# Patient Record
Sex: Female | Born: 1990 | Race: Black or African American | Hispanic: No | Marital: Single | Smoking: Never smoker
Health system: Southern US, Community
[De-identification: ages and names within clinical notes are randomized; demographics above are authoritative.]

## PROBLEM LIST (undated history)

## (undated) DIAGNOSIS — Z9889 Other specified postprocedural states: Secondary | ICD-10-CM

## (undated) DIAGNOSIS — F419 Anxiety disorder, unspecified: Secondary | ICD-10-CM

## (undated) DIAGNOSIS — J45909 Unspecified asthma, uncomplicated: Secondary | ICD-10-CM

## (undated) DIAGNOSIS — M549 Dorsalgia, unspecified: Secondary | ICD-10-CM

## (undated) DIAGNOSIS — F909 Attention-deficit hyperactivity disorder, unspecified type: Secondary | ICD-10-CM

## (undated) DIAGNOSIS — M1990 Unspecified osteoarthritis, unspecified site: Secondary | ICD-10-CM

## (undated) DIAGNOSIS — F32A Depression, unspecified: Secondary | ICD-10-CM

## (undated) DIAGNOSIS — T7840XA Allergy, unspecified, initial encounter: Secondary | ICD-10-CM

## (undated) DIAGNOSIS — R112 Nausea with vomiting, unspecified: Secondary | ICD-10-CM

## (undated) HISTORY — DX: Anxiety disorder, unspecified: F41.9

## (undated) HISTORY — DX: Dorsalgia, unspecified: M54.9

## (undated) HISTORY — DX: Attention-deficit hyperactivity disorder, unspecified type: F90.9

## (undated) HISTORY — DX: Unspecified osteoarthritis, unspecified site: M19.90

## (undated) HISTORY — DX: Allergy, unspecified, initial encounter: T78.40XA

## (undated) HISTORY — DX: Depression, unspecified: F32.A

## (undated) HISTORY — PX: ARTHROSCOPIC REPAIR ACL: SUR80

---

## 2021-01-30 DIAGNOSIS — M6281 Muscle weakness (generalized): Secondary | ICD-10-CM | POA: Diagnosis not present

## 2021-01-30 DIAGNOSIS — M25672 Stiffness of left ankle, not elsewhere classified: Secondary | ICD-10-CM | POA: Diagnosis not present

## 2021-01-30 DIAGNOSIS — M25572 Pain in left ankle and joints of left foot: Secondary | ICD-10-CM | POA: Diagnosis not present

## 2021-02-04 DIAGNOSIS — M6281 Muscle weakness (generalized): Secondary | ICD-10-CM | POA: Diagnosis not present

## 2021-02-04 DIAGNOSIS — M25572 Pain in left ankle and joints of left foot: Secondary | ICD-10-CM | POA: Diagnosis not present

## 2021-02-04 DIAGNOSIS — M25672 Stiffness of left ankle, not elsewhere classified: Secondary | ICD-10-CM | POA: Diagnosis not present

## 2021-02-06 DIAGNOSIS — M25672 Stiffness of left ankle, not elsewhere classified: Secondary | ICD-10-CM | POA: Diagnosis not present

## 2021-02-06 DIAGNOSIS — M6281 Muscle weakness (generalized): Secondary | ICD-10-CM | POA: Diagnosis not present

## 2021-02-06 DIAGNOSIS — M25572 Pain in left ankle and joints of left foot: Secondary | ICD-10-CM | POA: Diagnosis not present

## 2021-02-11 DIAGNOSIS — M6281 Muscle weakness (generalized): Secondary | ICD-10-CM | POA: Diagnosis not present

## 2021-02-11 DIAGNOSIS — M25672 Stiffness of left ankle, not elsewhere classified: Secondary | ICD-10-CM | POA: Diagnosis not present

## 2021-02-11 DIAGNOSIS — M25572 Pain in left ankle and joints of left foot: Secondary | ICD-10-CM | POA: Diagnosis not present

## 2021-02-18 DIAGNOSIS — M25572 Pain in left ankle and joints of left foot: Secondary | ICD-10-CM | POA: Diagnosis not present

## 2021-02-18 DIAGNOSIS — M6281 Muscle weakness (generalized): Secondary | ICD-10-CM | POA: Diagnosis not present

## 2021-02-18 DIAGNOSIS — M25672 Stiffness of left ankle, not elsewhere classified: Secondary | ICD-10-CM | POA: Diagnosis not present

## 2021-02-24 DIAGNOSIS — Z13220 Encounter for screening for lipoid disorders: Secondary | ICD-10-CM | POA: Diagnosis not present

## 2021-02-24 DIAGNOSIS — Z131 Encounter for screening for diabetes mellitus: Secondary | ICD-10-CM | POA: Diagnosis not present

## 2021-02-24 DIAGNOSIS — Z1339 Encounter for screening examination for other mental health and behavioral disorders: Secondary | ICD-10-CM | POA: Diagnosis not present

## 2021-02-24 DIAGNOSIS — Z20822 Contact with and (suspected) exposure to covid-19: Secondary | ICD-10-CM | POA: Diagnosis not present

## 2021-02-24 DIAGNOSIS — R350 Frequency of micturition: Secondary | ICD-10-CM | POA: Diagnosis not present

## 2021-02-24 DIAGNOSIS — Z0000 Encounter for general adult medical examination without abnormal findings: Secondary | ICD-10-CM | POA: Diagnosis not present

## 2021-02-24 DIAGNOSIS — Z7251 High risk heterosexual behavior: Secondary | ICD-10-CM | POA: Diagnosis not present

## 2021-02-24 DIAGNOSIS — E559 Vitamin D deficiency, unspecified: Secondary | ICD-10-CM | POA: Diagnosis not present

## 2021-02-24 DIAGNOSIS — Z114 Encounter for screening for human immunodeficiency virus [HIV]: Secondary | ICD-10-CM | POA: Diagnosis not present

## 2021-02-24 DIAGNOSIS — R5383 Other fatigue: Secondary | ICD-10-CM | POA: Diagnosis not present

## 2021-02-24 DIAGNOSIS — N915 Oligomenorrhea, unspecified: Secondary | ICD-10-CM | POA: Diagnosis not present

## 2021-02-25 DIAGNOSIS — M6281 Muscle weakness (generalized): Secondary | ICD-10-CM | POA: Diagnosis not present

## 2021-02-25 DIAGNOSIS — M25672 Stiffness of left ankle, not elsewhere classified: Secondary | ICD-10-CM | POA: Diagnosis not present

## 2021-02-25 DIAGNOSIS — M25572 Pain in left ankle and joints of left foot: Secondary | ICD-10-CM | POA: Diagnosis not present

## 2021-02-27 DIAGNOSIS — M6281 Muscle weakness (generalized): Secondary | ICD-10-CM | POA: Diagnosis not present

## 2021-02-27 DIAGNOSIS — M25572 Pain in left ankle and joints of left foot: Secondary | ICD-10-CM | POA: Diagnosis not present

## 2021-02-27 DIAGNOSIS — M25672 Stiffness of left ankle, not elsewhere classified: Secondary | ICD-10-CM | POA: Diagnosis not present

## 2021-03-04 DIAGNOSIS — M25672 Stiffness of left ankle, not elsewhere classified: Secondary | ICD-10-CM | POA: Diagnosis not present

## 2021-03-04 DIAGNOSIS — M6281 Muscle weakness (generalized): Secondary | ICD-10-CM | POA: Diagnosis not present

## 2021-03-04 DIAGNOSIS — M25572 Pain in left ankle and joints of left foot: Secondary | ICD-10-CM | POA: Diagnosis not present

## 2021-03-13 DIAGNOSIS — M6281 Muscle weakness (generalized): Secondary | ICD-10-CM | POA: Diagnosis not present

## 2021-03-13 DIAGNOSIS — M25672 Stiffness of left ankle, not elsewhere classified: Secondary | ICD-10-CM | POA: Diagnosis not present

## 2021-03-13 DIAGNOSIS — M25572 Pain in left ankle and joints of left foot: Secondary | ICD-10-CM | POA: Diagnosis not present

## 2021-03-25 DIAGNOSIS — F4312 Post-traumatic stress disorder, chronic: Secondary | ICD-10-CM | POA: Diagnosis not present

## 2021-04-03 DIAGNOSIS — F4312 Post-traumatic stress disorder, chronic: Secondary | ICD-10-CM | POA: Diagnosis not present

## 2021-04-10 DIAGNOSIS — F4312 Post-traumatic stress disorder, chronic: Secondary | ICD-10-CM | POA: Diagnosis not present

## 2021-04-23 DIAGNOSIS — F4312 Post-traumatic stress disorder, chronic: Secondary | ICD-10-CM | POA: Diagnosis not present

## 2021-05-01 DIAGNOSIS — F4312 Post-traumatic stress disorder, chronic: Secondary | ICD-10-CM | POA: Diagnosis not present

## 2021-05-08 DIAGNOSIS — F4312 Post-traumatic stress disorder, chronic: Secondary | ICD-10-CM | POA: Diagnosis not present

## 2021-05-15 DIAGNOSIS — F411 Generalized anxiety disorder: Secondary | ICD-10-CM | POA: Diagnosis not present

## 2021-05-15 DIAGNOSIS — F331 Major depressive disorder, recurrent, moderate: Secondary | ICD-10-CM | POA: Diagnosis not present

## 2021-05-15 DIAGNOSIS — F4310 Post-traumatic stress disorder, unspecified: Secondary | ICD-10-CM | POA: Diagnosis not present

## 2021-05-22 DIAGNOSIS — F4312 Post-traumatic stress disorder, chronic: Secondary | ICD-10-CM | POA: Diagnosis not present

## 2021-05-29 DIAGNOSIS — F4312 Post-traumatic stress disorder, chronic: Secondary | ICD-10-CM | POA: Diagnosis not present

## 2021-06-05 DIAGNOSIS — F4312 Post-traumatic stress disorder, chronic: Secondary | ICD-10-CM | POA: Diagnosis not present

## 2021-06-12 DIAGNOSIS — F4312 Post-traumatic stress disorder, chronic: Secondary | ICD-10-CM | POA: Diagnosis not present

## 2021-06-24 DIAGNOSIS — F331 Major depressive disorder, recurrent, moderate: Secondary | ICD-10-CM | POA: Diagnosis not present

## 2021-06-24 DIAGNOSIS — F900 Attention-deficit hyperactivity disorder, predominantly inattentive type: Secondary | ICD-10-CM | POA: Diagnosis not present

## 2021-06-24 DIAGNOSIS — F4310 Post-traumatic stress disorder, unspecified: Secondary | ICD-10-CM | POA: Diagnosis not present

## 2021-06-24 DIAGNOSIS — F411 Generalized anxiety disorder: Secondary | ICD-10-CM | POA: Diagnosis not present

## 2021-06-26 DIAGNOSIS — F900 Attention-deficit hyperactivity disorder, predominantly inattentive type: Secondary | ICD-10-CM | POA: Diagnosis not present

## 2021-06-26 DIAGNOSIS — F4312 Post-traumatic stress disorder, chronic: Secondary | ICD-10-CM | POA: Diagnosis not present

## 2021-07-03 DIAGNOSIS — F4312 Post-traumatic stress disorder, chronic: Secondary | ICD-10-CM | POA: Diagnosis not present

## 2021-07-17 DIAGNOSIS — F4312 Post-traumatic stress disorder, chronic: Secondary | ICD-10-CM | POA: Diagnosis not present

## 2021-07-24 DIAGNOSIS — F411 Generalized anxiety disorder: Secondary | ICD-10-CM | POA: Diagnosis not present

## 2021-07-24 DIAGNOSIS — F331 Major depressive disorder, recurrent, moderate: Secondary | ICD-10-CM | POA: Diagnosis not present

## 2021-07-24 DIAGNOSIS — F4310 Post-traumatic stress disorder, unspecified: Secondary | ICD-10-CM | POA: Diagnosis not present

## 2021-07-24 DIAGNOSIS — F900 Attention-deficit hyperactivity disorder, predominantly inattentive type: Secondary | ICD-10-CM | POA: Diagnosis not present

## 2021-07-29 DIAGNOSIS — F4312 Post-traumatic stress disorder, chronic: Secondary | ICD-10-CM | POA: Diagnosis not present

## 2021-08-05 DIAGNOSIS — F4312 Post-traumatic stress disorder, chronic: Secondary | ICD-10-CM | POA: Diagnosis not present

## 2021-08-14 DIAGNOSIS — F4312 Post-traumatic stress disorder, chronic: Secondary | ICD-10-CM | POA: Diagnosis not present

## 2021-08-21 DIAGNOSIS — F4310 Post-traumatic stress disorder, unspecified: Secondary | ICD-10-CM | POA: Diagnosis not present

## 2021-08-21 DIAGNOSIS — F411 Generalized anxiety disorder: Secondary | ICD-10-CM | POA: Diagnosis not present

## 2021-08-21 DIAGNOSIS — F900 Attention-deficit hyperactivity disorder, predominantly inattentive type: Secondary | ICD-10-CM | POA: Diagnosis not present

## 2021-08-21 DIAGNOSIS — F4312 Post-traumatic stress disorder, chronic: Secondary | ICD-10-CM | POA: Diagnosis not present

## 2021-08-21 DIAGNOSIS — F331 Major depressive disorder, recurrent, moderate: Secondary | ICD-10-CM | POA: Diagnosis not present

## 2021-08-28 DIAGNOSIS — F4312 Post-traumatic stress disorder, chronic: Secondary | ICD-10-CM | POA: Diagnosis not present

## 2021-09-04 DIAGNOSIS — F4312 Post-traumatic stress disorder, chronic: Secondary | ICD-10-CM | POA: Diagnosis not present

## 2021-09-25 DIAGNOSIS — F4312 Post-traumatic stress disorder, chronic: Secondary | ICD-10-CM | POA: Diagnosis not present

## 2021-10-09 DIAGNOSIS — F4312 Post-traumatic stress disorder, chronic: Secondary | ICD-10-CM | POA: Diagnosis not present

## 2021-10-21 DIAGNOSIS — F411 Generalized anxiety disorder: Secondary | ICD-10-CM | POA: Diagnosis not present

## 2021-10-21 DIAGNOSIS — F331 Major depressive disorder, recurrent, moderate: Secondary | ICD-10-CM | POA: Diagnosis not present

## 2021-10-21 DIAGNOSIS — F900 Attention-deficit hyperactivity disorder, predominantly inattentive type: Secondary | ICD-10-CM | POA: Diagnosis not present

## 2021-10-21 DIAGNOSIS — F4310 Post-traumatic stress disorder, unspecified: Secondary | ICD-10-CM | POA: Diagnosis not present

## 2021-10-25 ENCOUNTER — Telehealth: Payer: Self-pay

## 2021-10-25 NOTE — Telephone Encounter (Signed)
L/m for pt that her appt for [facility] tomorrow will need to be moved to another site.  Appt time of 10:00 can be honored at any of our [facility] sites for 01/02.

## 2021-10-26 ENCOUNTER — Ambulatory Visit: Payer: Self-pay

## 2021-10-30 DIAGNOSIS — F4312 Post-traumatic stress disorder, chronic: Secondary | ICD-10-CM | POA: Diagnosis not present

## 2021-11-06 DIAGNOSIS — F4312 Post-traumatic stress disorder, chronic: Secondary | ICD-10-CM | POA: Diagnosis not present

## 2021-11-13 DIAGNOSIS — F4312 Post-traumatic stress disorder, chronic: Secondary | ICD-10-CM | POA: Diagnosis not present

## 2021-11-24 DIAGNOSIS — F4310 Post-traumatic stress disorder, unspecified: Secondary | ICD-10-CM | POA: Diagnosis not present

## 2021-11-24 DIAGNOSIS — F331 Major depressive disorder, recurrent, moderate: Secondary | ICD-10-CM | POA: Diagnosis not present

## 2021-11-24 DIAGNOSIS — F900 Attention-deficit hyperactivity disorder, predominantly inattentive type: Secondary | ICD-10-CM | POA: Diagnosis not present

## 2021-11-24 DIAGNOSIS — F411 Generalized anxiety disorder: Secondary | ICD-10-CM | POA: Diagnosis not present

## 2021-11-27 DIAGNOSIS — F4312 Post-traumatic stress disorder, chronic: Secondary | ICD-10-CM | POA: Diagnosis not present

## 2021-11-29 ENCOUNTER — Other Ambulatory Visit: Payer: Self-pay

## 2021-11-29 ENCOUNTER — Emergency Department: Payer: BC Managed Care – PPO

## 2021-11-29 ENCOUNTER — Emergency Department
Admission: EM | Admit: 2021-11-29 | Discharge: 2021-11-30 | Disposition: A | Discharge status: Home / Self Care | Payer: BC Managed Care – PPO | Attending: Student in an Organized Health Care Education/Training Program | Admitting: Student in an Organized Health Care Education/Training Program

## 2021-11-29 DIAGNOSIS — N200 Calculus of kidney: Secondary | ICD-10-CM | POA: Diagnosis not present

## 2021-11-29 DIAGNOSIS — M545 Low back pain, unspecified: Secondary | ICD-10-CM | POA: Diagnosis not present

## 2021-11-29 DIAGNOSIS — R1031 Right lower quadrant pain: Secondary | ICD-10-CM | POA: Diagnosis not present

## 2021-11-29 DIAGNOSIS — N132 Hydronephrosis with renal and ureteral calculous obstruction: Secondary | ICD-10-CM | POA: Diagnosis not present

## 2021-11-29 LAB — CBC
HCT: 42.3 % (ref 36.0–46.0)
Hemoglobin: 13.9 g/dL (ref 12.0–15.0)
MCH: 29 pg (ref 26.0–34.0)
MCHC: 32.9 g/dL (ref 30.0–36.0)
MCV: 88.3 fL (ref 80.0–100.0)
Platelets: 290 10*3/uL (ref 150–400)
RBC: 4.79 MIL/uL (ref 3.87–5.11)
RDW: 13.2 % (ref 11.5–15.5)
WBC: 11.8 10*3/uL — ABNORMAL HIGH (ref 4.0–10.5)
nRBC: 0 % (ref 0.0–0.2)

## 2021-11-29 LAB — URINALYSIS, ROUTINE W REFLEX MICROSCOPIC
Bacteria, UA: NONE SEEN
Bilirubin Urine: NEGATIVE
Glucose, UA: NEGATIVE mg/dL
Ketones, ur: NEGATIVE mg/dL
Nitrite: NEGATIVE
Protein, ur: NEGATIVE mg/dL
Specific Gravity, Urine: 1.025 (ref 1.005–1.030)
pH: 8.5 — ABNORMAL HIGH (ref 5.0–8.0)

## 2021-11-29 LAB — BASIC METABOLIC PANEL
Anion gap: 7 (ref 5–15)
BUN: 13 mg/dL (ref 6–20)
CO2: 25 mmol/L (ref 22–32)
Calcium: 8.9 mg/dL (ref 8.9–10.3)
Chloride: 104 mmol/L (ref 98–111)
Creatinine, Ser: 1.07 mg/dL — ABNORMAL HIGH (ref 0.44–1.00)
GFR, Estimated: >60 mL/min (ref >60)
Glucose, Bld: 141 mg/dL — ABNORMAL HIGH (ref 70–99)
Potassium: 3.4 mmol/L — ABNORMAL LOW (ref 3.5–5.1)
Sodium: 136 mmol/L (ref 135–145)

## 2021-11-29 LAB — POC URINE PREG, ED: Preg Test, Ur: NEGATIVE

## 2021-11-29 MED ORDER — ONDANSETRON HCL 4 MG/2ML IJ SOLN
4.0000 mg | Freq: Once | INTRAMUSCULAR | Status: AC
  Filled 2021-11-29: qty 2

## 2021-11-29 MED ORDER — SODIUM CHLORIDE 0.9 % IV BOLUS
1000.0000 mL | Freq: Once | INTRAVENOUS | Status: AC
  Administered 2021-11-29: 1000 mL via INTRAVENOUS

## 2021-11-29 MED ORDER — ONDANSETRON HCL 4 MG/2ML IJ SOLN
INTRAMUSCULAR | Status: AC
  Administered 2021-11-29: 4 mg via INTRAVENOUS
  Filled 2021-11-29: qty 2

## 2021-11-29 MED ORDER — MORPHINE SULFATE (PF) 4 MG/ML IV SOLN
4.0000 mg | INTRAVENOUS | Status: DC | PRN
  Administered 2021-11-29: 4 mg via INTRAVENOUS
  Filled 2021-11-29: qty 1

## 2021-11-29 MED ORDER — KETOROLAC TROMETHAMINE 30 MG/ML IJ SOLN
15.0000 mg | Freq: Once | INTRAMUSCULAR | Status: AC
Start: 2021-11-29 — End: 2021-11-29
  Administered 2021-11-29: 15 mg via INTRAVENOUS
  Filled 2021-11-29: qty 1

## 2021-11-29 NOTE — ED Provider Notes (Signed)
[facility] Provider Note    Event Date/Time   First MD Initiated Contact with Patient 11/29/21 2303     (approximate)   History   Flank Pain   HPI  Tracey Cervantes is a 31 y.o. female who presents to the ER for evaluation of right lower quadrant flank pain that started this afternoon.  Has a history of kidney stones.  She denies any fevers.  Is having some nausea.  States the pain is moderate to severe.  Denies any chest pain or shortness of breath.     Physical Exam   Triage Vital Signs: ED Triage Vitals  Enc Vitals Group     BP 11/29/21 2229 (!) 118/108     Pulse Rate 11/29/21 2229 89     Resp 11/29/21 2229 20     Temp 11/29/21 2229 98 F (36.7 C)     Temp src --      SpO2 11/29/21 2229 100 %     Weight --      Height --      Head Circumference --      Peak Flow --      Pain Score 11/29/21 2232 8     Pain Loc --      Pain Edu? --      Excl. in [place]? --     Most recent vital signs: Vitals:   11/29/21 2229 11/29/21 2356  BP: (!) 118/108 (!) 138/94  Pulse: 89 80  Resp: 20 19  Temp: 98 F (36.7 C)   SpO2: 100% 100%     Constitutional: Alert  Eyes: Conjunctivae are normal.  Head: Atraumatic. Nose: No congestion/rhinnorhea. Mouth/Throat: Mucous membranes are moist.   Neck: Painless ROM.  Cardiovascular:   Good peripheral circulation. Respiratory: Normal respiratory effort.  No retractions.  Gastrointestinal: Soft and nontender. + right CVA ttp Musculoskeletal:  no deformity Neurologic:  MAE spontaneously. No gross focal neurologic deficits are appreciated.  Skin:  Skin is warm, dry and intact. No rash noted. Psychiatric: Mood and affect are normal. Speech and behavior are normal.    ED Results / Procedures / Treatments   Labs (all labs ordered are listed, but only abnormal results are displayed) Labs Reviewed  URINALYSIS, ROUTINE W REFLEX MICROSCOPIC - Abnormal; Notable for the following components:      Result Value   pH  8.5 (*)    Hgb urine dipstick TRACE (*)    Leukocytes,Ua TRACE (*)    All other components within normal limits  BASIC METABOLIC PANEL - Abnormal; Notable for the following components:   Potassium 3.4 (*)    Glucose, Bld 141 (*)    Creatinine, Ser 1.07 (*)    All other components within normal limits  CBC - Abnormal; Notable for the following components:   WBC 11.8 (*)    All other components within normal limits  POC URINE PREG, ED - Normal     EKG     RADIOLOGY Please see ED Course for my review and interpretation.  I personally reviewed all radiographic images ordered to evaluate for the above acute complaints and reviewed radiology reports and findings.  These findings were personally discussed with the patient.  Please see medical record for radiology report.    PROCEDURES:  Critical Care performed: No  Procedures   MEDICATIONS ORDERED IN ED: Medications  morphine (PF) 4 MG/ML injection 4 mg (4 mg Intravenous Given 11/29/21 2351)  ketorolac (TORADOL) 30 MG/ML injection 15 mg (15  mg Intravenous Given 11/29/21 2349)  ondansetron (ZOFRAN) injection 4 mg (4 mg Intravenous Given 11/29/21 2347)  sodium chloride 0.9 % bolus 1,000 mL (1,000 mLs Intravenous New Bag/Given 11/29/21 2349)     IMPRESSION / MDM / ASSESSMENT AND PLAN / ED COURSE  I reviewed the triage vital signs and the nursing notes.                              Differential diagnosis includes, but is not limited to, Pyelonephritis, msk strain, kidney stone, colitis, radiculopathy, shingles  Patient presenting with acute flank pain as described above.  She is hemodynamically stable afebrile.  Mild leukocytosis but normal renal function.  CT imaging ordered to evaluate for the blood differential.  Will order IV narcotic medication including IV morphine as well as IV Toradol.  She is not pregnant we will give IV fluids and reassess.  Clinical Course as of 11/30/21 0036  Nancy Fetter Nov 29, 2021  2304 CT renal by my  review appears consitent with right distal stone. [PR]  Mon Nov 30, 2021  0031 Patient reassessed and feeling improved.  Tolerating p.o.  As her pain is currently well controlled I do not believe that she will require hospitalization is appropriate for outpatient follow-up.  Patient agreeable to plan. [PR]    Clinical Course User Index [PR] Merlyn Lot, MD     FINAL CLINICAL IMPRESSION(S) / ED DIAGNOSES   Final diagnoses:  Kidney stone on right side     Rx / DC Orders   ED Discharge Orders          Ordered    oxyCODONE-acetaminophen (PERCOCET) 5-325 MG tablet  Every 4 hours PRN        11/30/21 0035    ondansetron (ZOFRAN) 4 MG tablet  Every 8 hours PRN        11/30/21 0035    tamsulosin (FLOMAX) 0.4 MG CAPS capsule  Daily after supper        11/30/21 0035             Note:  This document was prepared using Dragon voice recognition software and may include unintentional dictation errors.    Merlyn Lot, MD 11/30/21 [phone]

## 2021-11-29 NOTE — ED Triage Notes (Signed)
Patient reports history of kidney stones, states she has had low back pain which has gradually increased and now has right flank pain that radiates around to abdomen x 5-6 hours. Reports nausea. AOX4, ambulatory to triage.

## 2021-11-30 MED ORDER — TAMSULOSIN HCL 0.4 MG PO CAPS: 0.4000 mg | ORAL_CAPSULE | Freq: Every day | ORAL | 0 refills | Status: DC

## 2021-11-30 MED ORDER — ONDANSETRON HCL 4 MG PO TABS: 4.0000 mg | ORAL_TABLET | Freq: Three times a day (TID) | ORAL | 0 refills | Status: DC | PRN

## 2021-11-30 MED ORDER — OXYCODONE-ACETAMINOPHEN 5-325 MG PO TABS
1.0000 | ORAL_TABLET | ORAL | 0 refills | Status: DC | PRN
Start: 2021-11-30 — End: 2022-06-28

## 2021-12-04 DIAGNOSIS — F4312 Post-traumatic stress disorder, chronic: Secondary | ICD-10-CM | POA: Diagnosis not present

## 2021-12-11 DIAGNOSIS — F4312 Post-traumatic stress disorder, chronic: Secondary | ICD-10-CM | POA: Diagnosis not present

## 2021-12-18 DIAGNOSIS — F4312 Post-traumatic stress disorder, chronic: Secondary | ICD-10-CM | POA: Diagnosis not present

## 2021-12-21 DIAGNOSIS — F900 Attention-deficit hyperactivity disorder, predominantly inattentive type: Secondary | ICD-10-CM | POA: Diagnosis not present

## 2021-12-21 DIAGNOSIS — F4310 Post-traumatic stress disorder, unspecified: Secondary | ICD-10-CM | POA: Diagnosis not present

## 2021-12-21 DIAGNOSIS — F331 Major depressive disorder, recurrent, moderate: Secondary | ICD-10-CM | POA: Diagnosis not present

## 2021-12-30 ENCOUNTER — Encounter: Payer: Self-pay | Admitting: Medical

## 2021-12-30 ENCOUNTER — Ambulatory Visit: Payer: BC Managed Care – PPO | Admitting: Medical

## 2021-12-30 ENCOUNTER — Other Ambulatory Visit: Payer: Self-pay

## 2021-12-30 VITALS — BP 126/78 | HR 83 | Temp 97.4°F | Resp 16

## 2021-12-30 DIAGNOSIS — M25569 Pain in unspecified knee: Secondary | ICD-10-CM | POA: Insufficient documentation

## 2021-12-30 DIAGNOSIS — M2140 Flat foot [pes planus] (acquired), unspecified foot: Secondary | ICD-10-CM | POA: Insufficient documentation

## 2021-12-30 DIAGNOSIS — H5210 Myopia, unspecified eye: Secondary | ICD-10-CM | POA: Insufficient documentation

## 2021-12-30 DIAGNOSIS — Z7189 Other specified counseling: Secondary | ICD-10-CM | POA: Insufficient documentation

## 2021-12-30 DIAGNOSIS — J301 Allergic rhinitis due to pollen: Secondary | ICD-10-CM | POA: Insufficient documentation

## 2021-12-30 DIAGNOSIS — J0100 Acute maxillary sinusitis, unspecified: Secondary | ICD-10-CM

## 2021-12-30 DIAGNOSIS — J45909 Unspecified asthma, uncomplicated: Secondary | ICD-10-CM | POA: Insufficient documentation

## 2021-12-30 DIAGNOSIS — M6750 Plica syndrome, unspecified knee: Secondary | ICD-10-CM | POA: Insufficient documentation

## 2021-12-30 MED ORDER — AMOXICILLIN-POT CLAVULANATE 875-125 MG PO TABS: 1.0000 | ORAL_TABLET | Freq: Two times a day (BID) | ORAL | 0 refills | Status: DC

## 2021-12-30 MED ORDER — PREDNISONE 10 MG (21) PO TBPK: ORAL_TABLET | ORAL | 0 refills | Status: DC

## 2021-12-30 NOTE — Progress Notes (Unsigned)
° °  Subjective:    Patient ID: Tracey Cervantes, female    DOB: 1991-06-10, 31 y.o.   MRN: [id number]  HPI 31 yo female in non acute distress.  Presents today with  symptoms of  nasal congestion, headache, mucus in throat. Maxillary medication.  Restarted Zyrtec 3 weeks ago, using flonase.Marland Kitchen  Hx of Asthma with illness and with going into the cold air.  Advil cold and sinus x since Friday.   Review of Systems  Constitutional:  Negative for chills and fever.  HENT:  Positive for congestion, ear pain, postnasal drip, rhinorrhea, sinus pressure, sinus pain (maxillary) and sore throat (with PND). Negative for sneezing.   Respiratory:  Negative for cough and shortness of breath.   Cardiovascular:  Negative for chest pain.    Allergiy to pinapple and bananas.    Objective:   Physical Exam        Assessment & Plan:

## 2021-12-30 NOTE — Patient Instructions (Signed)

## 2022-01-01 DIAGNOSIS — F4312 Post-traumatic stress disorder, chronic: Secondary | ICD-10-CM | POA: Diagnosis not present

## 2022-01-08 DIAGNOSIS — F4312 Post-traumatic stress disorder, chronic: Secondary | ICD-10-CM | POA: Diagnosis not present

## 2022-01-22 DIAGNOSIS — F4312 Post-traumatic stress disorder, chronic: Secondary | ICD-10-CM | POA: Diagnosis not present

## 2022-02-05 DIAGNOSIS — F4312 Post-traumatic stress disorder, chronic: Secondary | ICD-10-CM | POA: Diagnosis not present

## 2022-02-12 DIAGNOSIS — F4312 Post-traumatic stress disorder, chronic: Secondary | ICD-10-CM | POA: Diagnosis not present

## 2022-02-16 DIAGNOSIS — F900 Attention-deficit hyperactivity disorder, predominantly inattentive type: Secondary | ICD-10-CM | POA: Diagnosis not present

## 2022-02-16 DIAGNOSIS — F4310 Post-traumatic stress disorder, unspecified: Secondary | ICD-10-CM | POA: Diagnosis not present

## 2022-02-16 DIAGNOSIS — F411 Generalized anxiety disorder: Secondary | ICD-10-CM | POA: Diagnosis not present

## 2022-02-16 DIAGNOSIS — F331 Major depressive disorder, recurrent, moderate: Secondary | ICD-10-CM | POA: Diagnosis not present

## 2022-02-25 DIAGNOSIS — F4312 Post-traumatic stress disorder, chronic: Secondary | ICD-10-CM | POA: Diagnosis not present

## 2022-03-05 DIAGNOSIS — F4312 Post-traumatic stress disorder, chronic: Secondary | ICD-10-CM | POA: Diagnosis not present

## 2022-03-23 DIAGNOSIS — F4312 Post-traumatic stress disorder, chronic: Secondary | ICD-10-CM | POA: Diagnosis not present

## 2022-04-02 DIAGNOSIS — F4312 Post-traumatic stress disorder, chronic: Secondary | ICD-10-CM | POA: Diagnosis not present

## 2022-04-13 DIAGNOSIS — F331 Major depressive disorder, recurrent, moderate: Secondary | ICD-10-CM | POA: Diagnosis not present

## 2022-04-13 DIAGNOSIS — F900 Attention-deficit hyperactivity disorder, predominantly inattentive type: Secondary | ICD-10-CM | POA: Diagnosis not present

## 2022-04-13 DIAGNOSIS — F411 Generalized anxiety disorder: Secondary | ICD-10-CM | POA: Diagnosis not present

## 2022-04-13 DIAGNOSIS — F4310 Post-traumatic stress disorder, unspecified: Secondary | ICD-10-CM | POA: Diagnosis not present

## 2022-04-16 DIAGNOSIS — F4312 Post-traumatic stress disorder, chronic: Secondary | ICD-10-CM | POA: Diagnosis not present

## 2022-04-23 DIAGNOSIS — F4312 Post-traumatic stress disorder, chronic: Secondary | ICD-10-CM | POA: Diagnosis not present

## 2022-05-25 DIAGNOSIS — F331 Major depressive disorder, recurrent, moderate: Secondary | ICD-10-CM | POA: Diagnosis not present

## 2022-05-25 DIAGNOSIS — F411 Generalized anxiety disorder: Secondary | ICD-10-CM | POA: Diagnosis not present

## 2022-05-25 DIAGNOSIS — F4310 Post-traumatic stress disorder, unspecified: Secondary | ICD-10-CM | POA: Diagnosis not present

## 2022-05-25 DIAGNOSIS — F900 Attention-deficit hyperactivity disorder, predominantly inattentive type: Secondary | ICD-10-CM | POA: Diagnosis not present

## 2022-06-07 ENCOUNTER — Ambulatory Visit (INDEPENDENT_AMBULATORY_CARE_PROVIDER_SITE_OTHER): Payer: Self-pay | Admitting: Registered Nurse

## 2022-06-07 ENCOUNTER — Encounter: Payer: Self-pay | Admitting: Registered Nurse

## 2022-06-07 VITALS — BP 130/90 | HR 83 | Temp 97.6°F | Ht 63.0 in | Wt 235.6 lb

## 2022-06-07 DIAGNOSIS — R030 Elevated blood-pressure reading, without diagnosis of hypertension: Secondary | ICD-10-CM

## 2022-06-07 DIAGNOSIS — H65192 Other acute nonsuppurative otitis media, left ear: Secondary | ICD-10-CM

## 2022-06-07 MED ORDER — AMOXICILLIN-POT CLAVULANATE 875-125 MG PO TABS: 1.0000 | ORAL_TABLET | Freq: Two times a day (BID) | ORAL | 0 refills | Status: AC

## 2022-06-07 NOTE — Patient Instructions (Signed)

## 2022-06-07 NOTE — Progress Notes (Signed)
Subjective:    Patient ID: Tracey Cervantes, female    DOB: [birth year], 31 y.o.   MRN: [id number]  31y/o african [place] female patient established here for evaluation left ear/jaw pain.  Denied discharge/fever/chills/n/v/d.  Pain does radiate into jaw.  Has noticed some grinding/clenching of teeth.  Has not seen dentist because doesn't feel like typical cavity.  Has a little sinus pressure/pain left side only took OTC pain medication and flonase nasal.      Review of Systems  Constitutional:  Negative for activity change, appetite change, chills, diaphoresis, fatigue and fever.  HENT:  Positive for ear pain, sinus pressure and sinus pain. Negative for congestion, dental problem, drooling, ear discharge, facial swelling, hearing loss, mouth sores, nosebleeds, postnasal drip, rhinorrhea, sneezing, sore throat, tinnitus, trouble swallowing and voice change.   Eyes:  Negative for photophobia and visual disturbance.  Respiratory:  Negative for cough, choking, shortness of breath, wheezing and stridor.   Cardiovascular:  Negative for chest pain.  Gastrointestinal:  Negative for diarrhea, nausea and vomiting.  Endocrine: Negative for cold intolerance and heat intolerance.  Genitourinary:  Negative for difficulty urinating.  Musculoskeletal:  Negative for back pain, gait problem, myalgias, neck pain and neck stiffness.  Skin:  Negative for color change, rash and wound.  Allergic/Immunologic: Positive for environmental allergies. Negative for food allergies.  Neurological:  Negative for dizziness, tremors, seizures, syncope, facial asymmetry, speech difficulty, weakness, light-headedness, numbness and headaches.  Hematological:  Negative for adenopathy. Does not bruise/bleed easily.  Psychiatric/Behavioral:  Negative for agitation, confusion and sleep disturbance.        Objective:   Physical Exam Vitals and nursing note reviewed.  Constitutional:      General: She is awake. She is not in  acute distress.    Appearance: Normal appearance. She is well-developed and well-groomed. She is obese. She is not ill-appearing, toxic-appearing or diaphoretic.  HENT:     Head: Normocephalic and atraumatic.     Jaw: There is normal jaw occlusion. Tenderness and pain on movement present. No trismus, swelling or malocclusion.     Salivary Glands: Right salivary gland is not diffusely enlarged or tender. Left salivary gland is not diffusely enlarged or tender.     Right Ear: Hearing, ear canal and external ear normal. No decreased hearing noted. No laceration, drainage, swelling or tenderness. A middle ear effusion is present. There is no impacted cerumen. No foreign body. No mastoid tenderness. No PE tube. No hemotympanum. Tympanic membrane is not injected, scarred, perforated, erythematous, retracted or bulging.     Left Ear: Hearing, ear canal and external ear normal. No decreased hearing noted. Swelling and tenderness present. No laceration or drainage. A middle ear effusion is present. There is no impacted cerumen. No foreign body. No mastoid tenderness. No PE tube. No hemotympanum. Tympanic membrane is injected, erythematous and bulging. Tympanic membrane is not scarred, perforated or retracted.     Ears:     Comments: Bilateral TMs intact air fluid level clear noted; erythema left TM/canal only; no debris auditory canal or cerumen observed; preauricular/postauricalar TTP left side only; left frontal and maxillary sinuses TTP; cobblestoning posterior pharynx; bilateral allergic shiners    Nose: Mucosal edema present. No nasal deformity, septal deviation, laceration, congestion or rhinorrhea.     Right Sinus: Maxillary sinus tenderness and frontal sinus tenderness present.     Left Sinus: No maxillary sinus tenderness or frontal sinus tenderness.     Mouth/Throat:     Lips: Pink. No lesions.  Mouth: Mucous membranes are moist. Mucous membranes are not pale, not dry and not cyanotic. No  lacerations, oral lesions or angioedema.     Dentition: No dental abscesses or gum lesions.     Pharynx: Uvula midline. Pharyngeal swelling and posterior oropharyngeal erythema present. No oropharyngeal exudate or uvula swelling.     Tonsils: No tonsillar abscesses.  Eyes:     General: Lids are normal. Vision grossly intact. Gaze aligned appropriately. Allergic shiner present. No scleral icterus.       Right eye: No foreign body, discharge or hordeolum.        Left eye: No foreign body, discharge or hordeolum.     Extraocular Movements: Extraocular movements intact.     Right eye: Normal extraocular motion and no nystagmus.     Left eye: Normal extraocular motion and no nystagmus.     Conjunctiva/sclera: Conjunctivae normal.     Right eye: Right conjunctiva is not injected. No chemosis, exudate or hemorrhage.    Left eye: Left conjunctiva is not injected. No chemosis, exudate or hemorrhage.    Pupils: Pupils are equal, round, and reactive to light. Pupils are equal.     Right eye: Pupil is round and reactive.     Left eye: Pupil is round and reactive.  Neck:     Thyroid: No thyroid mass, thyromegaly or thyroid tenderness.     Trachea: Trachea and phonation normal. No tracheal tenderness or tracheal deviation.  Cardiovascular:     Rate and Rhythm: Normal rate and regular rhythm.     Pulses: Normal pulses.          Radial pulses are 2+ on the right side and 2+ on the left side.  Pulmonary:     Effort: Pulmonary effort is normal. No accessory muscle usage or respiratory distress.     Breath sounds: Normal breath sounds and air entry. No stridor, decreased air movement or transmitted upper airway sounds. No decreased breath sounds, wheezing, rhonchi or rales.     Comments: Spoke full sentences without difficulty; respiration even and unlabored RA; no cough observed in exam room Chest:     Chest wall: No tenderness.  Abdominal:     General: There is no distension.     Palpations: Abdomen  is soft.  Musculoskeletal:        General: No tenderness. Normal range of motion.     Right shoulder: Normal.     Right hand: No swelling. Normal strength. Normal capillary refill.     Left hand: No swelling. Normal strength. Normal capillary refill.     Cervical back: Normal range of motion and neck supple. No swelling, edema, deformity, erythema, signs of trauma, lacerations, rigidity, spasms, tenderness or crepitus. No pain with movement, spinous process tenderness or muscular tenderness. Normal range of motion.     Thoracic back: No swelling, edema, deformity, signs of trauma, lacerations or tenderness. Normal range of motion.  Lymphadenopathy:     Head:     Right side of head: No submental, submandibular, tonsillar, preauricular, posterior auricular or occipital adenopathy.     Left side of head: No submental, submandibular, tonsillar, preauricular, posterior auricular or occipital adenopathy.     Cervical: No cervical adenopathy.     Right cervical: No superficial, deep or posterior cervical adenopathy.    Left cervical: No superficial, deep or posterior cervical adenopathy.  Skin:    General: Skin is warm and dry.     Capillary Refill: Capillary refill takes less than 2  seconds.     Coloration: Skin is not ashen, cyanotic, jaundiced, mottled, pale or sallow.     Findings: No abrasion, abscess, acne, bruising, burn, ecchymosis, erythema, signs of injury, laceration, lesion, petechiae, rash or wound.     Nails: There is no clubbing.  Neurological:     General: No focal deficit present.     Mental Status: She is alert and oriented to person, place, and time. Mental status is at baseline. She is not disoriented.     GCS: GCS eye subscore is 4. GCS verbal subscore is 5. GCS motor subscore is 6.     Cranial Nerves: No cranial nerve deficit, dysarthria or facial asymmetry.     Sensory: Sensation is intact. No sensory deficit.     Motor: Motor function is intact. No weakness, tremor,  atrophy, abnormal muscle tone or seizure activity.     Coordination: Coordination is intact. Coordination normal.     Gait: Gait is intact. Gait normal.     Comments: On/off exam table without difficulty; gait sure and steady in clinic; bilateral hand grasp equal 5/5  Psychiatric:        Attention and Perception: Attention and perception normal.        Mood and Affect: Mood and affect normal.        Speech: Speech normal.        Behavior: Behavior normal. Behavior is cooperative.        Thought Content: Thought content normal.        Cognition and Memory: Cognition and memory normal.        Judgment: Judgment normal.           Assessment & Plan:   A-acute left otitis media and elevated blood pressure in clinic without diagnosis hypertension  P-Supportive treatment. Augmentin 875mg  po BID x 10 days #20 RF0 electronic Rx to patient pharmacy of choice  Tylenol 1000mg  po q6h prn pain/fever.   No evidence of invasive bacterial infection, non toxic and well hydrated.  I do not see where any further testing or imaging is necessary at this time.   I will suggest supportive care, rest, good hygiene and encourage the patient to take adequate fluids.  The patient is to return to clinic or EMERGENCY ROOM if symptoms worsen or change significantly e.g. ear pain, fever, purulent discharge from ears or bleeding.  Exitcare handout on otitis media printed and given to patient.   Patient verbalized agreement and understanding of treatment plan and had no further questions at this time.     Most likely related to ear pain/acute illness.  BP check again when not ill/having pain.  Goal less than 130/80.

## 2022-06-11 DIAGNOSIS — Z01419 Encounter for gynecological examination (general) (routine) without abnormal findings: Secondary | ICD-10-CM | POA: Diagnosis not present

## 2022-06-11 DIAGNOSIS — Z6841 Body Mass Index (BMI) 40.0 and over, adult: Secondary | ICD-10-CM | POA: Diagnosis not present

## 2022-06-11 DIAGNOSIS — Z124 Encounter for screening for malignant neoplasm of cervix: Secondary | ICD-10-CM | POA: Diagnosis not present

## 2022-06-11 DIAGNOSIS — Z1151 Encounter for screening for human papillomavirus (HPV): Secondary | ICD-10-CM | POA: Diagnosis not present

## 2022-06-18 DIAGNOSIS — F4312 Post-traumatic stress disorder, chronic: Secondary | ICD-10-CM | POA: Diagnosis not present

## 2022-06-22 DIAGNOSIS — F900 Attention-deficit hyperactivity disorder, predominantly inattentive type: Secondary | ICD-10-CM | POA: Diagnosis not present

## 2022-06-22 DIAGNOSIS — F411 Generalized anxiety disorder: Secondary | ICD-10-CM | POA: Diagnosis not present

## 2022-06-22 DIAGNOSIS — F331 Major depressive disorder, recurrent, moderate: Secondary | ICD-10-CM | POA: Diagnosis not present

## 2022-06-22 DIAGNOSIS — F4310 Post-traumatic stress disorder, unspecified: Secondary | ICD-10-CM | POA: Diagnosis not present

## 2022-06-25 DIAGNOSIS — F4312 Post-traumatic stress disorder, chronic: Secondary | ICD-10-CM | POA: Diagnosis not present

## 2022-06-28 ENCOUNTER — Ambulatory Visit (INDEPENDENT_AMBULATORY_CARE_PROVIDER_SITE_OTHER): Payer: Self-pay | Admitting: Physician Assistant

## 2022-06-28 ENCOUNTER — Encounter: Payer: Self-pay | Admitting: Physician Assistant

## 2022-06-28 VITALS — BP 130/90 | HR 96 | Temp 97.7°F | Ht 63.0 in | Wt 231.6 lb

## 2022-06-28 DIAGNOSIS — R0981 Nasal congestion: Secondary | ICD-10-CM

## 2022-06-28 DIAGNOSIS — U071 COVID-19: Secondary | ICD-10-CM

## 2022-06-28 LAB — POC COVID19 BINAXNOW: SARS Coronavirus 2 Ag: POSITIVE — AB

## 2022-06-28 NOTE — Progress Notes (Signed)
[facility] [address] [place], [place] [address]   Office Visit Note  Patient Name: Tracey Cervantes Date of Birth [id number]  Medical Record number [id number]  Date of Service: 06/28/2022  Chief Complaint  Patient presents with   Sinusitis    Started Thur with sinus headache. Was outside for most of the day. Took Otc med when she got home. Woke up with ST, congested, temp was 100.00, COVID was neg. Felt better after nap. Coughing up mucus and tired over the weekend. Feels like she has fluid in ears. Chest congestion.Ears dont hurt. Had infection in left ear 2 weeks ago.      31 y/o F presents to the clinic for c/o sinus congestion, mild body aches, sore throat and headaches x 4 days ago. She was traveling over the past weekend and came in for covid test due to her symptoms. She tested negative. She went back home to [place] and continued to have URI symptoms with headaches. She did have a low grade fever. She returned yesterday and feeling the same today. Not feeling any better. She is fully vaccinated and boosted. No known covid exposure.   Sinusitis Associated symptoms include congestion, headaches, sinus pressure and a sore throat. Pertinent negatives include no ear pain.   Pt is here for a sick visit.   Current Medication:  Outpatient Encounter Medications as of 06/28/2022  Medication Sig   citalopram (CELEXA) 20 MG tablet Take 20 mg by mouth daily.   CRYSELLE-28 0.3-30 MG-MCG tablet Take 1 tablet by mouth daily.   fluticasone (FLONASE) 50 MCG/ACT nasal spray Place into both nostrils daily.   VYVANSE 30 MG capsule Take 30 mg by mouth daily.   hydrOXYzine (ATARAX) 25 MG tablet Take 25 mg by mouth daily.   [DISCONTINUED] amphetamine-dextroamphetamine (ADDERALL XR) 15 MG 24 hr capsule Take by mouth every morning. (Patient not taking: Reported on 06/07/2022)   [DISCONTINUED] ondansetron (ZOFRAN) 4 MG tablet Take 1 tablet (4 mg total) by mouth every 8 (eight) hours as needed  for nausea or vomiting. (Patient not taking: Reported on 12/30/2021)   [DISCONTINUED] oxyCODONE-acetaminophen (PERCOCET) 5-325 MG tablet Take 1 tablet by mouth every 4 (four) hours as needed for severe pain. (Patient not taking: Reported on 12/30/2021)   [DISCONTINUED] tamsulosin (FLOMAX) 0.4 MG CAPS capsule Take 1 capsule (0.4 mg total) by mouth daily after supper. (Patient not taking: Reported on 12/30/2021)   No facility-administered encounter medications on file as of 06/28/2022.      Medical History: No past medical history on file.   Vital Signs: BP (!) 130/90 (BP Location: Left Arm, Patient Position: Sitting, Cuff Size: Normal)   Pulse 96   Temp 97.7 F (36.5 C) (Tympanic)   Ht 5\' 3"  (1.6 m)   Wt 231 lb 9.6 oz (105.1 kg)   SpO2 100%   BMI 41.03 kg/m    Review of Systems  Constitutional:  Positive for fever.  HENT:  Positive for congestion, postnasal drip, sinus pressure, sinus pain and sore throat. Negative for ear discharge, ear pain and trouble swallowing.   Respiratory: Negative.    Cardiovascular: Negative.   Neurological:  Positive for headaches. Negative for dizziness.    Physical Exam Constitutional:      Appearance: Normal appearance.  HENT:     Head: Atraumatic.     Right Ear: Tympanic membrane, ear canal and external ear normal.     Left Ear: Tympanic membrane, ear canal and external ear normal.  Nose: Nose normal.     Mouth/Throat:     Mouth: Mucous membranes are moist.     Pharynx: Oropharynx is clear.  Eyes:     Extraocular Movements: Extraocular movements intact.  Cardiovascular:     Rate and Rhythm: Normal rate and regular rhythm.  Pulmonary:     Effort: Pulmonary effort is normal.     Breath sounds: Normal breath sounds.  Musculoskeletal:     Cervical back: Neck supple.  Skin:    General: Skin is warm.  Neurological:     Mental Status: She is alert.  Psychiatric:        Mood and Affect: Mood normal.        Behavior: Behavior normal.         Thought Content: Thought content normal.        Judgment: Judgment normal.       Assessment/Plan: 1. Covid-19 virus infection Reviewed positive test result with patient. Stay well hydrated with clear liquids.  Recommended to isolate 5 days from the start of your symptoms then additional 5 days of wearing a mask in public.  Take otc Ibuprofen or Tylenol for symptom relief.  RTC if any difficulty arises or if symptoms don't improve.  Pt verbalized understanding and in agreement.  Continue to watch for worsening symptoms.   2. Sinus congestion Take otc Tylenol sinus cough and cold if needed per bottle directions.     General Counseling: Mckyla verbalizes understanding of the findings of todays visit and agrees with plan of treatment. I have discussed any further diagnostic evaluation that may be needed or ordered today. We also reviewed her medications today. she has been encouraged to call the office with any questions or concerns that should arise related to todays visit.   Orders Placed This Encounter  Procedures   POC COVID-19    No orders of the defined types were placed in this encounter.   Time spent:20 Minutes    Gilberto Better, [place] Physician Assistant

## 2022-07-02 DIAGNOSIS — F4312 Post-traumatic stress disorder, chronic: Secondary | ICD-10-CM | POA: Diagnosis not present

## 2022-07-09 DIAGNOSIS — F4312 Post-traumatic stress disorder, chronic: Secondary | ICD-10-CM | POA: Diagnosis not present

## 2022-07-16 DIAGNOSIS — F4312 Post-traumatic stress disorder, chronic: Secondary | ICD-10-CM | POA: Diagnosis not present

## 2022-07-22 DIAGNOSIS — F4310 Post-traumatic stress disorder, unspecified: Secondary | ICD-10-CM | POA: Diagnosis not present

## 2022-07-22 DIAGNOSIS — F900 Attention-deficit hyperactivity disorder, predominantly inattentive type: Secondary | ICD-10-CM | POA: Diagnosis not present

## 2022-07-22 DIAGNOSIS — F411 Generalized anxiety disorder: Secondary | ICD-10-CM | POA: Diagnosis not present

## 2022-07-22 DIAGNOSIS — F331 Major depressive disorder, recurrent, moderate: Secondary | ICD-10-CM | POA: Diagnosis not present

## 2022-07-24 IMAGING — CT CT RENAL STONE PROTOCOL
2 of 4 series · 16 of 46 positions shown, 18 images · non-contrast
Comparison: None.

CLINICAL DATA: Flank pain. Kidney stone suspected. Low back pain
extending towards the right.



[Series 2: stone full standard · axial · 0.71mm/px · z∈[-1105,-695]mm · 13 of 90 slices shown, 15 images]
[im 4/90  soft-tissue]
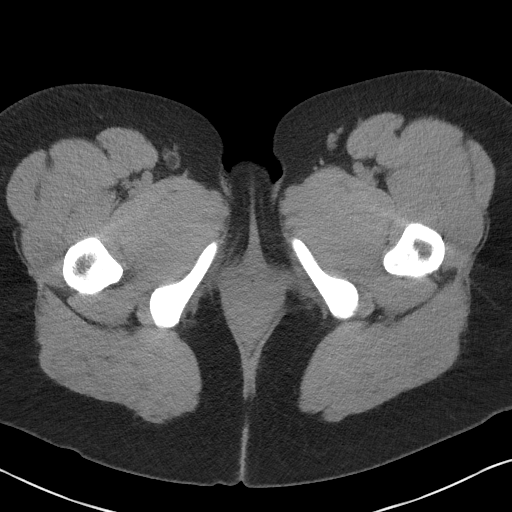
[im 4/90  bone]
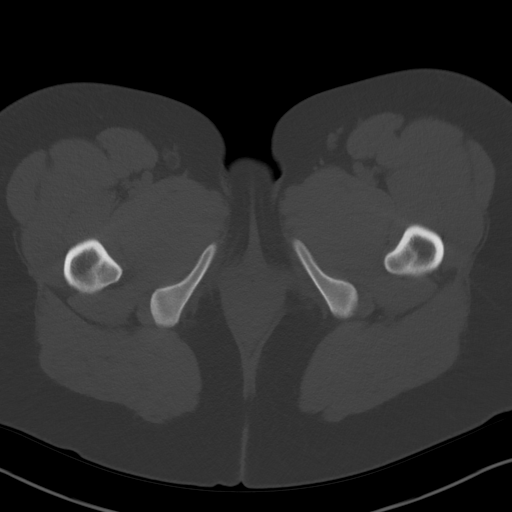
[im 11/90  soft-tissue]
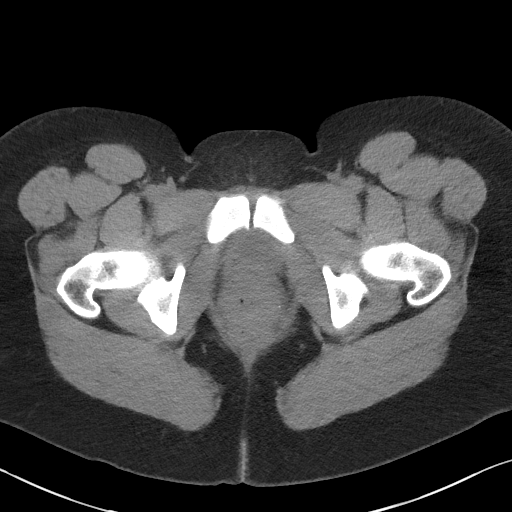
[im 18/90  soft-tissue]
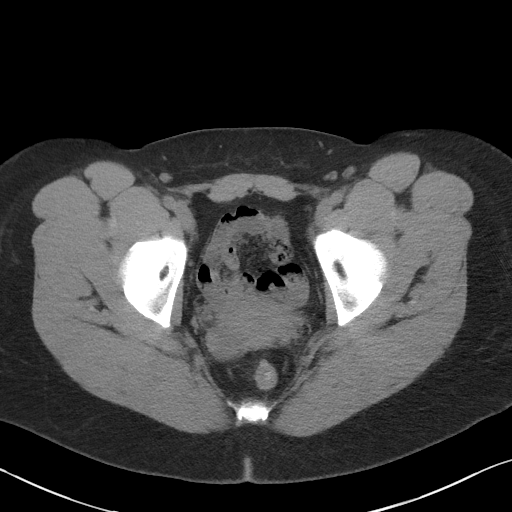
[im 25/90  soft-tissue]
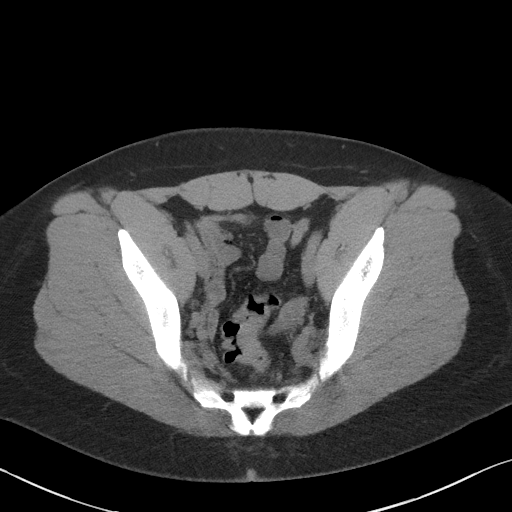
[im 33/90  soft-tissue]
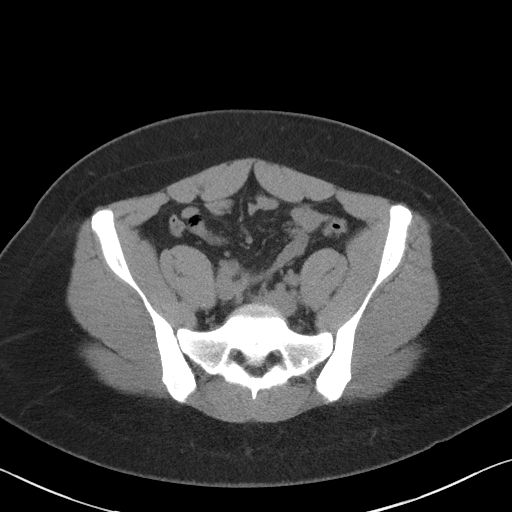
[im 40/90  soft-tissue]
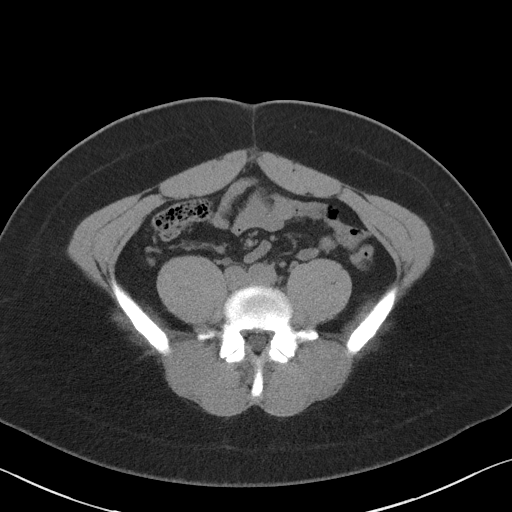
[im 47/90  soft-tissue]
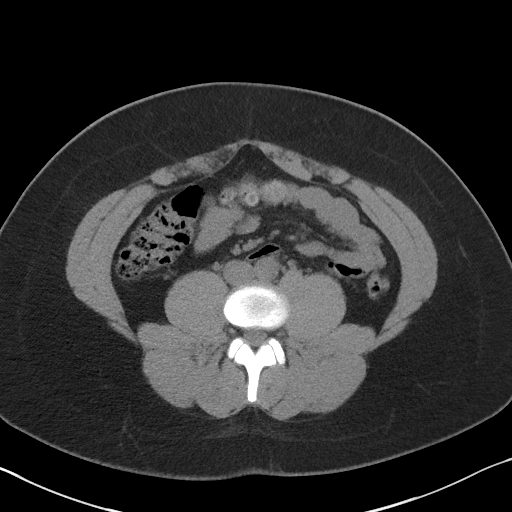
[im 50/90  soft-tissue]
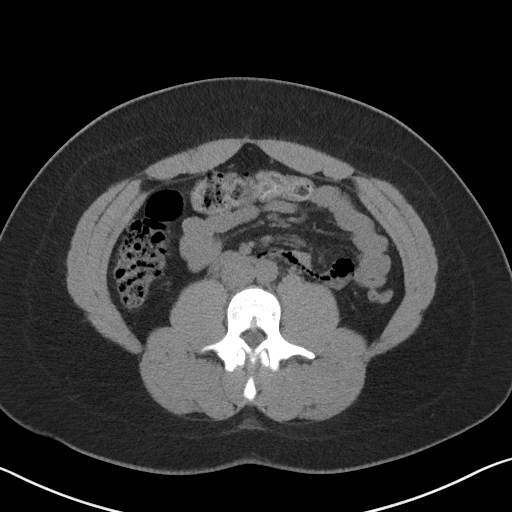
[im 57/90  soft-tissue]
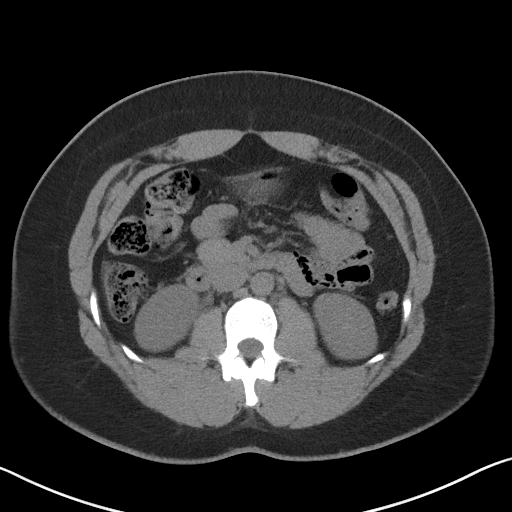
[im 57/90  bone]
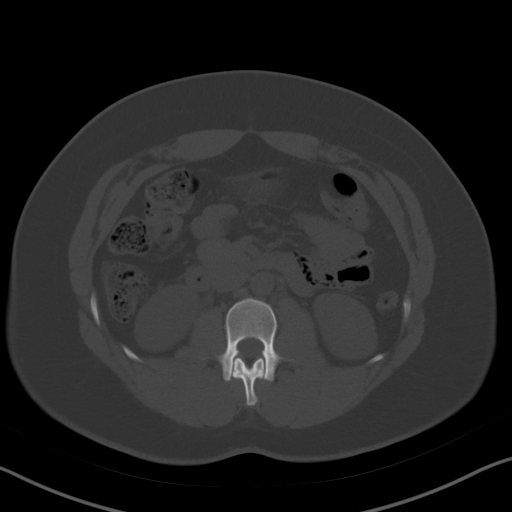
[im 65/90  soft-tissue]
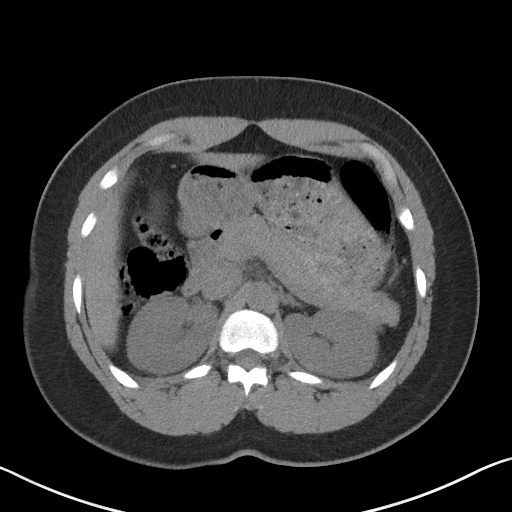
[im 72/90  soft-tissue]
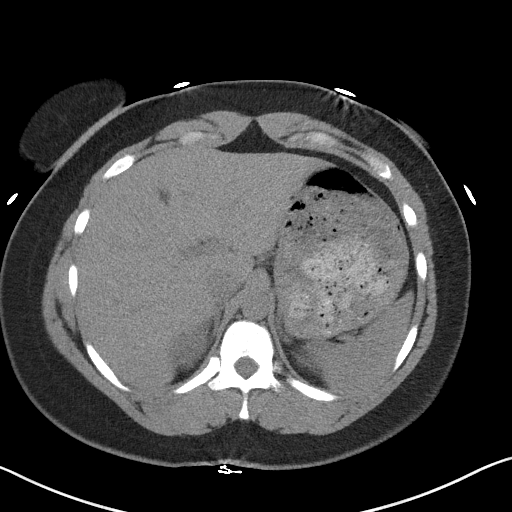
[im 79/90  soft-tissue]
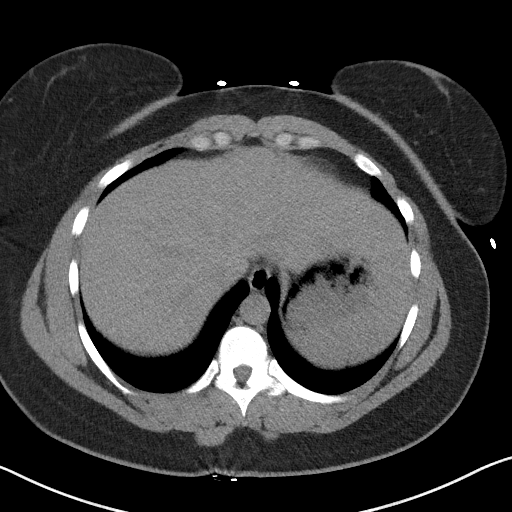
[im 86/90  soft-tissue]
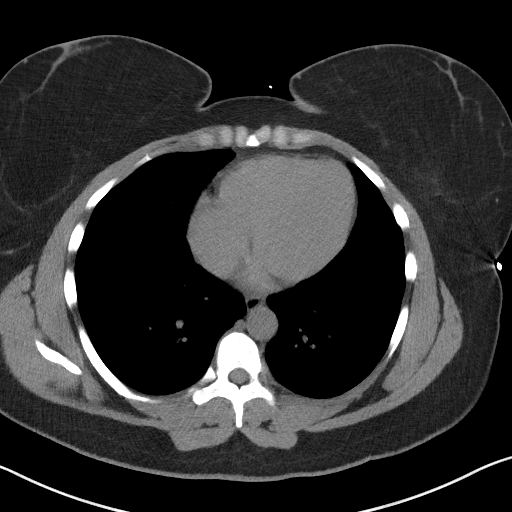

[Series 5: coronal · coronal · 0.70mm/px · 3 of 146 slices shown]
[im 49/146  soft-tissue]
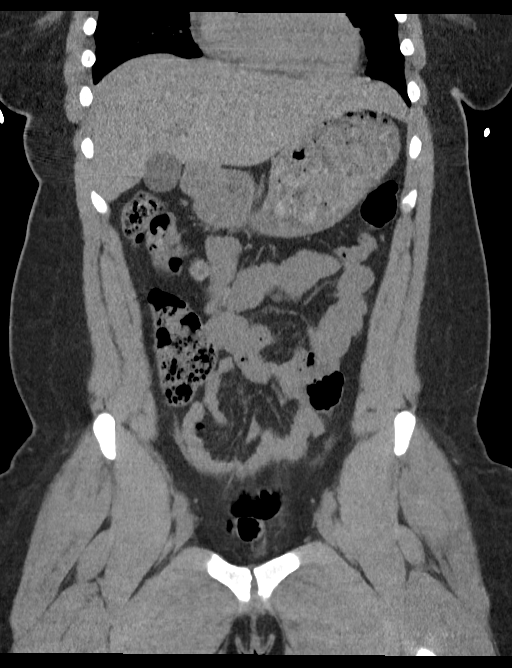
[im 65/146  soft-tissue]
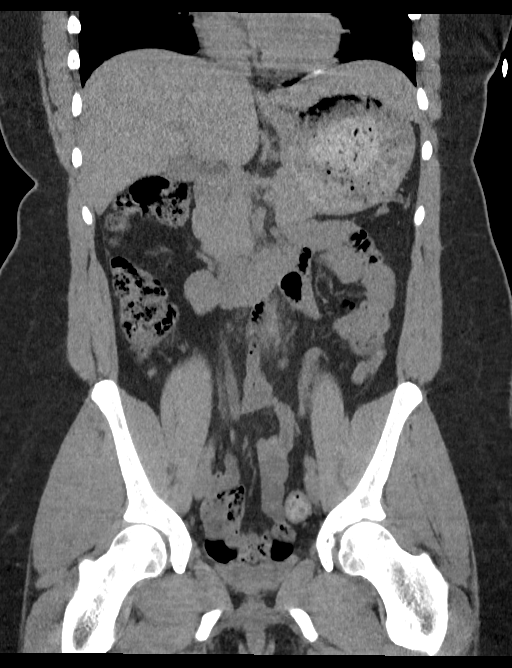
[im 81/146  soft-tissue]
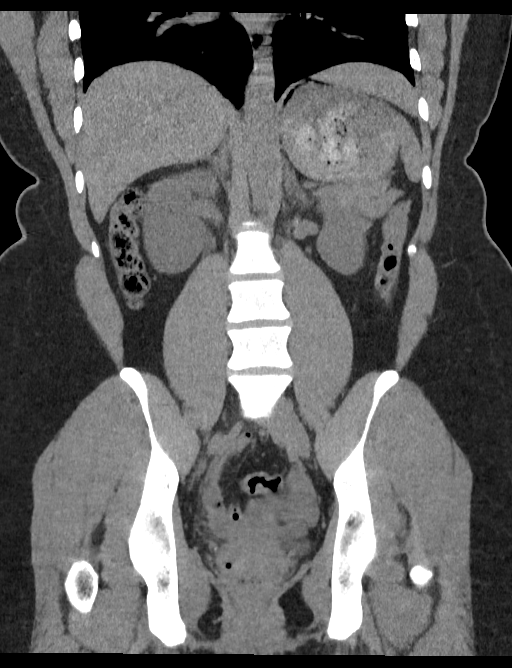

[16 of 46 positions shown; findings below may reference images not displayed]

FINDINGS: Lower chest: Lung bases are clear.

Hepatobiliary: Liver parenchyma is normal.  No calcified gallstones.

Pancreas: Normal

Spleen: Normal

Adrenals/Urinary Tract: Adrenal glands are normal. Left kidney
contains a 3 mm nonobstructing stone in the midportion. No
hydronephrosis on the left. Right kidney contains 4 are 5 small
stones, the largest in the midportion measuring 4 mm. There is mild
fullness of the renal collecting system and ureter to the level of
the UVJ where there is a 2 mm stone. No stone in the bladder.

Stomach/Bowel: Normal

Vascular/Lymphatic: Normal

Reproductive: Normal

Other: No free fluid or air.

Musculoskeletal: Normal
IMPRESSION: 2 mm stone at the right UVJ with mild right hydroureteronephrosis. 4
or 5 nonobstructing stones in the right kidney, the largest 4 mm.
Single nonobstructing 3 mm stone in the left kidney.

## 2022-07-27 DIAGNOSIS — F4312 Post-traumatic stress disorder, chronic: Secondary | ICD-10-CM | POA: Diagnosis not present

## 2022-08-06 DIAGNOSIS — F4312 Post-traumatic stress disorder, chronic: Secondary | ICD-10-CM | POA: Diagnosis not present

## 2022-08-13 DIAGNOSIS — F4312 Post-traumatic stress disorder, chronic: Secondary | ICD-10-CM | POA: Diagnosis not present

## 2022-08-18 DIAGNOSIS — F4310 Post-traumatic stress disorder, unspecified: Secondary | ICD-10-CM | POA: Diagnosis not present

## 2022-08-18 DIAGNOSIS — F331 Major depressive disorder, recurrent, moderate: Secondary | ICD-10-CM | POA: Diagnosis not present

## 2022-08-18 DIAGNOSIS — F900 Attention-deficit hyperactivity disorder, predominantly inattentive type: Secondary | ICD-10-CM | POA: Diagnosis not present

## 2022-08-18 DIAGNOSIS — F411 Generalized anxiety disorder: Secondary | ICD-10-CM | POA: Diagnosis not present

## 2022-08-20 DIAGNOSIS — F4312 Post-traumatic stress disorder, chronic: Secondary | ICD-10-CM | POA: Diagnosis not present

## 2022-09-03 DIAGNOSIS — F4312 Post-traumatic stress disorder, chronic: Secondary | ICD-10-CM | POA: Diagnosis not present

## 2022-09-14 DIAGNOSIS — F900 Attention-deficit hyperactivity disorder, predominantly inattentive type: Secondary | ICD-10-CM | POA: Diagnosis not present

## 2022-09-14 DIAGNOSIS — F411 Generalized anxiety disorder: Secondary | ICD-10-CM | POA: Diagnosis not present

## 2022-09-14 DIAGNOSIS — F331 Major depressive disorder, recurrent, moderate: Secondary | ICD-10-CM | POA: Diagnosis not present

## 2022-09-14 DIAGNOSIS — F4310 Post-traumatic stress disorder, unspecified: Secondary | ICD-10-CM | POA: Diagnosis not present

## 2022-09-28 DIAGNOSIS — F4312 Post-traumatic stress disorder, chronic: Secondary | ICD-10-CM | POA: Diagnosis not present

## 2022-09-29 NOTE — Progress Notes (Unsigned)
   There were no vitals taken for this visit.   Subjective:    Patient ID: Tracey Cervantes, female    DOB: [birth year], 31 y.o.   MRN: [id number]  HPI: Tracey Cervantes is a 31 y.o. female  No chief complaint on file.  Establish care:  her last physical was ***. Her last pap was ***.  Medical history includes ***.  Family history includes ***.  Asthma:  Back pain: She would like to discussed breast reduction due to back pain.   Relevant past medical, surgical, family and social history reviewed and updated as indicated. Interim medical history since our last visit reviewed. Allergies and medications reviewed and updated.  Review of Systems  Constitutional: Negative for fever or weight change.  Respiratory: Negative for cough and shortness of breath.   Cardiovascular: Negative for chest pain or palpitations.  Gastrointestinal: Negative for abdominal pain, no bowel changes.  Musculoskeletal: Negative for gait problem or joint swelling.  Skin: Negative for rash.  Neurological: Negative for dizziness or headache.  No other specific complaints in a complete review of systems (except as listed in HPI above).      Objective:    There were no vitals taken for this visit.  Wt Readings from Last 3 Encounters:  06/28/22 231 lb 9.6 oz (105.1 kg)  06/07/22 235 lb 9.6 oz (106.9 kg)    Physical Exam  Constitutional: Patient appears well-developed and well-nourished. Obese *** No distress.  HEENT: head atraumatic, normocephalic, pupils equal and reactive to light, ears ***, neck supple, throat within normal limits Cardiovascular: Normal rate, regular rhythm and normal heart sounds.  No murmur heard. No BLE edema. Pulmonary/Chest: Effort normal and breath sounds normal. No respiratory distress. Abdominal: Soft.  There is no tenderness. Psychiatric: Patient has a normal mood and affect. behavior is normal. Judgment and thought content normal.     Assessment & Plan:   Problem List Items  Addressed This Visit   None    Follow up plan: No follow-ups on file.

## 2022-09-30 ENCOUNTER — Ambulatory Visit (INDEPENDENT_AMBULATORY_CARE_PROVIDER_SITE_OTHER): Payer: Self-pay | Admitting: Nurse Practitioner

## 2022-09-30 ENCOUNTER — Other Ambulatory Visit: Payer: Self-pay

## 2022-09-30 ENCOUNTER — Encounter: Payer: Self-pay | Admitting: Nurse Practitioner

## 2022-09-30 VITALS — BP 112/72 | HR 98 | Temp 98.8°F | Resp 16 | Ht 63.0 in | Wt 239.2 lb

## 2022-09-30 DIAGNOSIS — Z113 Encounter for screening for infections with a predominantly sexual mode of transmission: Secondary | ICD-10-CM

## 2022-09-30 DIAGNOSIS — F330 Major depressive disorder, recurrent, mild: Secondary | ICD-10-CM | POA: Insufficient documentation

## 2022-09-30 DIAGNOSIS — Z114 Encounter for screening for human immunodeficiency virus [HIV]: Secondary | ICD-10-CM

## 2022-09-30 DIAGNOSIS — J301 Allergic rhinitis due to pollen: Secondary | ICD-10-CM

## 2022-09-30 DIAGNOSIS — Z131 Encounter for screening for diabetes mellitus: Secondary | ICD-10-CM

## 2022-09-30 DIAGNOSIS — Z7689 Persons encountering health services in other specified circumstances: Secondary | ICD-10-CM

## 2022-09-30 DIAGNOSIS — Z1159 Encounter for screening for other viral diseases: Secondary | ICD-10-CM

## 2022-09-30 DIAGNOSIS — Z13220 Encounter for screening for lipoid disorders: Secondary | ICD-10-CM

## 2022-09-30 DIAGNOSIS — Z130 Encounter for screening for diseases of the blood and blood-forming organs and certain disorders involving the immune mechanism: Secondary | ICD-10-CM

## 2022-09-30 DIAGNOSIS — N62 Hypertrophy of breast: Secondary | ICD-10-CM

## 2022-09-30 DIAGNOSIS — F411 Generalized anxiety disorder: Secondary | ICD-10-CM

## 2022-09-30 DIAGNOSIS — F902 Attention-deficit hyperactivity disorder, combined type: Secondary | ICD-10-CM

## 2022-09-30 DIAGNOSIS — Z6841 Body Mass Index (BMI) 40.0 and over, adult: Secondary | ICD-10-CM

## 2022-09-30 DIAGNOSIS — J4520 Mild intermittent asthma, uncomplicated: Secondary | ICD-10-CM

## 2022-09-30 MED ORDER — MONTELUKAST SODIUM 10 MG PO TABS: 10.0000 mg | ORAL_TABLET | Freq: Every day | ORAL | 3 refills | Status: DC

## 2022-09-30 NOTE — Assessment & Plan Note (Signed)
Singulair 10 mg daily.  Continue Flonase.

## 2022-09-30 NOTE — Assessment & Plan Note (Signed)
Start Singulair 10 mg daily.

## 2022-09-30 NOTE — Assessment & Plan Note (Signed)
Continue follow-up care with psychiatry.  Continue taking Effexor 150 mg daily and hydroxyzine 25 mg daily, Vyvanse 30 mg daily. 

## 2022-09-30 NOTE — Assessment & Plan Note (Signed)
Continue follow-up care with psychiatry.  Continue taking Effexor 150 mg daily and hydroxyzine 25 mg daily, Vyvanse 30 mg daily.

## 2022-09-30 NOTE — Assessment & Plan Note (Signed)
She has been working on lifestyle modification.  Patient has been unsuccessful with weight loss.  She would like to try zepbound.  Discussed side effects, and administration.  Discussed that it is not available at this time.  Like to start as soon as it is.  She is also interested in the [facility].  Referral placed.  Denies any pancreatitis or family history of thyroid cancer.

## 2022-10-01 LAB — COMPLETE METABOLIC PANEL WITH GFR
AG Ratio: 1.3 (calc) (ref 1.0–2.5)
ALT: 20 U/L (ref 6–29)
AST: 14 U/L (ref 10–30)
Albumin: 4 g/dL (ref 3.6–5.1)
Alkaline phosphatase (APISO): 71 U/L (ref 31–125)
BUN: 10 mg/dL (ref 7–25)
CO2: 28 mmol/L (ref 20–32)
Calcium: 9.3 mg/dL (ref 8.6–10.2)
Chloride: 106 mmol/L (ref 98–110)
Creat: 0.89 mg/dL (ref 0.50–0.97)
Globulin: 3.2 g/dL (calc) (ref 1.9–3.7)
Glucose, Bld: 112 mg/dL — ABNORMAL HIGH (ref 65–99)
Potassium: 4.4 mmol/L (ref 3.5–5.3)
Sodium: 142 mmol/L (ref 135–146)
Total Bilirubin: 0.3 mg/dL (ref 0.2–1.2)
Total Protein: 7.2 g/dL (ref 6.1–8.1)
eGFR: 89 mL/min/{1.73_m2} (ref >=60)

## 2022-10-01 LAB — LIPID PANEL
Cholesterol: 179 mg/dL (ref <200)
HDL: 61 mg/dL (ref >=50)
LDL Cholesterol (Calc): 102 mg/dL (calc) — ABNORMAL HIGH
Non-HDL Cholesterol (Calc): 118 mg/dL (calc) (ref <130)
Total CHOL/HDL Ratio: 2.9 (calc) (ref <5.0)
Triglycerides: 70 mg/dL (ref <150)

## 2022-10-01 LAB — RPR: RPR Ser Ql: NONREACTIVE

## 2022-10-01 LAB — CBC WITH DIFFERENTIAL/PLATELET
Absolute Monocytes: 566 cells/uL (ref 200–950)
Basophils Absolute: 21 cells/uL (ref 0–200)
Basophils Relative: 0.3 %
Eosinophils Absolute: 83 cells/uL (ref 15–500)
Eosinophils Relative: 1.2 %
HCT: 38.9 % (ref 35.0–45.0)
Hemoglobin: 13.1 g/dL (ref 11.7–15.5)
Lymphs Abs: 2325 cells/uL (ref 850–3900)
MCH: 29.2 pg (ref 27.0–33.0)
MCHC: 33.7 g/dL (ref 32.0–36.0)
MCV: 86.6 fL (ref 80.0–100.0)
MPV: 9.1 fL (ref 7.5–12.5)
Monocytes Relative: 8.2 %
Neutro Abs: 3905 cells/uL (ref 1500–7800)
Neutrophils Relative %: 56.6 %
Platelets: 298 10*3/uL (ref 140–400)
RBC: 4.49 10*6/uL (ref 3.80–5.10)
RDW: 12.7 % (ref 11.0–15.0)
Total Lymphocyte: 33.7 %
WBC: 6.9 10*3/uL (ref 3.8–10.8)

## 2022-10-01 LAB — HEPATITIS C ANTIBODY: Hepatitis C Ab: NONREACTIVE

## 2022-10-01 LAB — HEMOGLOBIN A1C
Hgb A1c MFr Bld: 6.2 % of total Hgb — ABNORMAL HIGH (ref <5.7)
Mean Plasma Glucose: 131 mg/dL
eAG (mmol/L): 7.3 mmol/L

## 2022-10-01 LAB — TSH: TSH: 1.79 mIU/L

## 2022-10-01 LAB — HIV ANTIBODY (ROUTINE TESTING W REFLEX): HIV 1&2 Ab, 4th Generation: NONREACTIVE

## 2022-10-04 ENCOUNTER — Other Ambulatory Visit: Payer: Self-pay | Admitting: Nurse Practitioner

## 2022-10-04 DIAGNOSIS — F4312 Post-traumatic stress disorder, chronic: Secondary | ICD-10-CM | POA: Diagnosis not present

## 2022-10-04 MED ORDER — ZEPBOUND 2.5 MG/0.5ML ~~LOC~~ SOAJ: 2.5000 mg | SUBCUTANEOUS | 0 refills | Status: DC

## 2022-10-11 ENCOUNTER — Encounter: Payer: Self-pay | Admitting: Nurse Practitioner

## 2022-10-11 DIAGNOSIS — F4312 Post-traumatic stress disorder, chronic: Secondary | ICD-10-CM | POA: Diagnosis not present

## 2022-10-11 DIAGNOSIS — F411 Generalized anxiety disorder: Secondary | ICD-10-CM | POA: Diagnosis not present

## 2022-10-11 DIAGNOSIS — F900 Attention-deficit hyperactivity disorder, predominantly inattentive type: Secondary | ICD-10-CM | POA: Diagnosis not present

## 2022-10-11 DIAGNOSIS — F4310 Post-traumatic stress disorder, unspecified: Secondary | ICD-10-CM | POA: Diagnosis not present

## 2022-10-11 DIAGNOSIS — F331 Major depressive disorder, recurrent, moderate: Secondary | ICD-10-CM | POA: Diagnosis not present

## 2022-10-12 DIAGNOSIS — F4312 Post-traumatic stress disorder, chronic: Secondary | ICD-10-CM | POA: Diagnosis not present

## 2022-10-26 DIAGNOSIS — F4312 Post-traumatic stress disorder, chronic: Secondary | ICD-10-CM | POA: Diagnosis not present

## 2022-10-26 NOTE — Telephone Encounter (Unsigned)
Copied from [place] [id number]. Topic: General - Inquiry >> Oct 26, 2022 12:34 PM Tracey Cervantes P wrote: Reason for CRM:  Pt called about the medication Zepbound and it was denied.  She wants to know where to go from there.  [phone]

## 2022-11-01 NOTE — Telephone Encounter (Signed)
The patient called in stating if the Zepbound is denied again could there be another medication similar that maybe approved. Please assist patient further

## 2022-11-02 DIAGNOSIS — F4312 Post-traumatic stress disorder, chronic: Secondary | ICD-10-CM | POA: Diagnosis not present

## 2022-11-09 DIAGNOSIS — F4312 Post-traumatic stress disorder, chronic: Secondary | ICD-10-CM | POA: Diagnosis not present

## 2022-11-10 DIAGNOSIS — F411 Generalized anxiety disorder: Secondary | ICD-10-CM | POA: Diagnosis not present

## 2022-11-10 DIAGNOSIS — F900 Attention-deficit hyperactivity disorder, predominantly inattentive type: Secondary | ICD-10-CM | POA: Diagnosis not present

## 2022-11-10 DIAGNOSIS — F331 Major depressive disorder, recurrent, moderate: Secondary | ICD-10-CM | POA: Diagnosis not present

## 2022-11-10 DIAGNOSIS — F4310 Post-traumatic stress disorder, unspecified: Secondary | ICD-10-CM | POA: Diagnosis not present

## 2022-11-11 ENCOUNTER — Ambulatory Visit: Payer: Self-pay | Admitting: Nurse Practitioner

## 2022-11-12 ENCOUNTER — Encounter: Payer: Self-pay | Admitting: Plastic Surgery

## 2022-11-12 ENCOUNTER — Ambulatory Visit: Payer: BC Managed Care – PPO | Admitting: Plastic Surgery

## 2022-11-12 VITALS — BP 161/97 | HR 86 | Ht 63.0 in | Wt 241.0 lb

## 2022-11-12 DIAGNOSIS — M542 Cervicalgia: Secondary | ICD-10-CM | POA: Diagnosis not present

## 2022-11-12 DIAGNOSIS — N62 Hypertrophy of breast: Secondary | ICD-10-CM | POA: Diagnosis not present

## 2022-11-12 DIAGNOSIS — R21 Rash and other nonspecific skin eruption: Secondary | ICD-10-CM

## 2022-11-12 DIAGNOSIS — M545 Low back pain, unspecified: Secondary | ICD-10-CM | POA: Diagnosis not present

## 2022-11-12 DIAGNOSIS — G8929 Other chronic pain: Secondary | ICD-10-CM

## 2022-11-12 DIAGNOSIS — Z803 Family history of malignant neoplasm of breast: Secondary | ICD-10-CM

## 2022-11-12 DIAGNOSIS — M546 Pain in thoracic spine: Secondary | ICD-10-CM

## 2022-11-12 DIAGNOSIS — M549 Dorsalgia, unspecified: Secondary | ICD-10-CM | POA: Insufficient documentation

## 2022-11-12 DIAGNOSIS — Z6841 Body Mass Index (BMI) 40.0 and over, adult: Secondary | ICD-10-CM

## 2022-11-12 NOTE — Progress Notes (Signed)
Patient ID: Tracey Cervantes, female    DOB: 06-Mar-1991, 32 y.o.   MRN: [id number]   Chief Complaint  Patient presents with   Consult   Breast Problem    Mammary Hyperplasia: The patient is a 32 y.o. female with a history of mammary hyperplasia for several years.  She has extremely large breasts causing symptoms that include the following: Back pain in the upper and lower back, including neck pain. She pulls or pins her bra straps to provide better lift and relief of the pressure and pain. She notices relief by holding her breast up manually.  Her shoulder straps cause grooves and pain and pressure that requires padding for relief. Pain medication is sometimes required with motrin and tylenol.  Activities that are hindered by enlarged breasts include: exercise and running.  She has tried supportive clothing as well as fitted bras without improvement.  Her breasts are extremely large and fairly symmetric with the left longer than the right.  She has hyperpigmentation of the inframammary area on both sides.  The sternal to nipple distance on the right is 35 cm and the left is 38 cm.  The IMF distance is 17 cm.  She is 5 feet 3 inches tall and weighs 241 pounds.  The BMI = 42.5 kg/m.  Preoperative bra size = 38 J cup.  The estimated excess breast tissue to be removed at the time of surgery = 850 grams on the left and 850 grams on the right.  Mammogram history: none.  Family history of breast cancer:  positive.  Tobacco use:  none.   The patient expresses the desire to pursue surgical intervention.  The patient has had knee surgery.    Review of Systems  Constitutional: Negative.   HENT: Negative.    Eyes: Negative.   Respiratory: Negative.  Negative for chest tightness.   Cardiovascular: Negative.   Gastrointestinal: Negative.   Endocrine: Negative.   Genitourinary: Negative.   Musculoskeletal:  Positive for back pain and neck pain.  Skin:  Positive for rash.    Past Medical History:   Diagnosis Date   ADHD    Allergy    Anxiety    Back pain    Depression     Past Surgical History:  Procedure Laterality Date   ARTHROSCOPIC REPAIR ACL Right       Current Outpatient Medications:    CRYSELLE-28 0.3-30 MG-MCG tablet, Take 1 tablet by mouth daily., Disp: , Rfl:    fluticasone (FLONASE) 50 MCG/ACT nasal spray, Place into both nostrils daily., Disp: , Rfl:    montelukast (SINGULAIR) 10 MG tablet, Take 1 tablet (10 mg total) by mouth at bedtime., Disp: 90 tablet, Rfl: 3   venlafaxine XR (EFFEXOR-XR) 150 MG 24 hr capsule, , Disp: , Rfl:    VYVANSE 30 MG capsule, Take 30 mg by mouth daily., Disp: , Rfl:    hydrOXYzine (ATARAX) 25 MG tablet, Take 25 mg by mouth daily., Disp: , Rfl:    Tirzepatide-Weight Management (ZEPBOUND) 2.5 MG/0.5ML SOAJ, Inject 2.5 mg into the skin once a week. (Patient not taking: Reported on 11/12/2022), Disp: 2 mL, Rfl: 0   Objective:   Vitals:   11/12/22 0852  BP: (!) 161/97  Pulse: 86  SpO2: 98%    Physical Exam Vitals and nursing note reviewed.  Constitutional:      Appearance: Normal appearance.  HENT:     Head: Normocephalic and atraumatic.  Cardiovascular:     Rate and Rhythm:  Normal rate.     Pulses: Normal pulses.  Pulmonary:     Effort: Pulmonary effort is normal.  Abdominal:     General: There is no distension.     Palpations: Abdomen is soft.     Tenderness: There is no abdominal tenderness. There is no guarding.  Musculoskeletal:        General: No swelling or deformity.  Skin:    General: Skin is warm.     Capillary Refill: Capillary refill takes less than 2 seconds.     Coloration: Skin is not jaundiced.     Findings: No bruising.  Neurological:     Mental Status: She is alert and oriented to person, place, and time.  Psychiatric:        Mood and Affect: Mood normal.        Behavior: Behavior normal.        Thought Content: Thought content normal.        Judgment: Judgment normal.     Assessment & Plan:   Symptomatic mammary hypertrophy  Chronic bilateral thoracic back pain  Neck pain  The procedure the patient selected and that was best for the patient was discussed. The risk were discussed and include but not limited to the following:  Breast asymmetry, fluid accumulation, firmness of the breast, inability to breast feed, loss of nipple or areola, skin loss, change in skin and nipple sensation, fat necrosis of the breast tissue, bleeding, infection and healing delay.  There are risks of anesthesia and injury to nerves or blood vessels.  Allergic reaction to tape, suture and skin glue are possible.  There will be swelling.  Any of these can lead to the need for revisional surgery.  A breast reduction has potential to interfere with diagnostic procedures in the future.  This procedure is best done when the breast is fully developed.  Changes in the breast will continue to occur over time: pregnancy, weight gain or weigh loss.    Total time: 40 minutes. This includes time spent with the patient during the visit as well as time spent before and after the visit reviewing the chart, documenting the encounter, ordering pertinent studies and literature for the patient.   Physical therapy: Ordered Mammogram: Not indicated  The patient is a good candidate for bilateral breast reduction with possible liposuction.  She knows she must do physical therapy and return for further evaluation and then we should be able to submit this to insurance for her.  Is also instructed on healthy eating.  Pictures were obtained of the patient and placed in the chart with the patient's or guardian's permission.   [place], DO

## 2022-11-16 DIAGNOSIS — F4312 Post-traumatic stress disorder, chronic: Secondary | ICD-10-CM | POA: Diagnosis not present

## 2022-11-17 ENCOUNTER — Telehealth: Payer: Self-pay | Admitting: Nurse Practitioner

## 2022-11-17 NOTE — Telephone Encounter (Signed)
Pt called to speak with nurse about Tirzepatide-Weight Management (ZEPBOUND) 2.5 MG/0.5ML SOAJ and being denied her PA / the PA resubmit was cancelled due to no appeal of why it was resubmitted / pt would like to discuss this issue / please advise

## 2022-11-18 ENCOUNTER — Other Ambulatory Visit: Payer: Self-pay | Admitting: Nurse Practitioner

## 2022-11-18 MED ORDER — SAXENDA 18 MG/3ML ~~LOC~~ SOPN: 0.6000 mg | PEN_INJECTOR | Freq: Every day | SUBCUTANEOUS | 0 refills | Status: DC

## 2022-11-18 MED ORDER — INSULIN PEN NEEDLE 32G X 6 MM MISC: 1.0000 | Freq: Every day | 0 refills | Status: DC

## 2022-11-18 NOTE — Telephone Encounter (Signed)
Patient stated she has to try wegovy or saxenda. Patient was informed wegovy is on back order. Can she please try Saxenda. Send to [facility]

## 2022-11-22 DIAGNOSIS — F4312 Post-traumatic stress disorder, chronic: Secondary | ICD-10-CM | POA: Diagnosis not present

## 2022-11-29 DIAGNOSIS — F4312 Post-traumatic stress disorder, chronic: Secondary | ICD-10-CM | POA: Diagnosis not present

## 2022-12-09 DIAGNOSIS — F331 Major depressive disorder, recurrent, moderate: Secondary | ICD-10-CM | POA: Diagnosis not present

## 2022-12-09 DIAGNOSIS — F4310 Post-traumatic stress disorder, unspecified: Secondary | ICD-10-CM | POA: Diagnosis not present

## 2022-12-09 DIAGNOSIS — F900 Attention-deficit hyperactivity disorder, predominantly inattentive type: Secondary | ICD-10-CM | POA: Diagnosis not present

## 2022-12-09 DIAGNOSIS — F411 Generalized anxiety disorder: Secondary | ICD-10-CM | POA: Diagnosis not present

## 2022-12-16 ENCOUNTER — Other Ambulatory Visit: Payer: Self-pay | Admitting: Nurse Practitioner

## 2022-12-16 NOTE — Telephone Encounter (Signed)
Please send in script for Saxenda so I can do a PA

## 2022-12-22 ENCOUNTER — Encounter: Payer: Self-pay | Admitting: Physical Therapy

## 2022-12-22 ENCOUNTER — Ambulatory Visit: Payer: BC Managed Care – PPO | Attending: Plastic Surgery | Admitting: Physical Therapy

## 2022-12-22 DIAGNOSIS — N62 Hypertrophy of breast: Secondary | ICD-10-CM | POA: Insufficient documentation

## 2022-12-22 DIAGNOSIS — M546 Pain in thoracic spine: Secondary | ICD-10-CM | POA: Insufficient documentation

## 2022-12-22 DIAGNOSIS — M542 Cervicalgia: Secondary | ICD-10-CM | POA: Diagnosis not present

## 2022-12-22 DIAGNOSIS — G8929 Other chronic pain: Secondary | ICD-10-CM | POA: Diagnosis not present

## 2022-12-22 NOTE — Therapy (Deleted)
OUTPATIENT PHYSICAL THERAPY NEURO EVALUATION   Patient Name: Tracey Cervantes MRN: [id number] DOB:06/08/1991, 32 y.o., female Today's Date: 12/22/2022   PCP: Marland Kitchen REFERRING PROVIDER: ***  END OF SESSION:   Past Medical History:  Diagnosis Date   ADHD    Allergy    Anxiety    Back pain    Depression    Past Surgical History:  Procedure Laterality Date   ARTHROSCOPIC REPAIR ACL Right    Patient Active Problem List   Diagnosis Date Noted   Symptomatic mammary hypertrophy 11/12/2022   Back pain 11/12/2022   Neck pain 11/12/2022   Class 3 severe obesity due to excess calories with serious comorbidity and body mass index (BMI) of 40.0 to 44.9 in adult [facility]) 09/30/2022   Mild episode of recurrent major depressive disorder ([place]) 09/30/2022   GAD (generalized anxiety disorder) 09/30/2022   Attention deficit hyperactivity disorder (ADHD), combined type 09/30/2022   Allergic rhinitis due to pollen 12/30/2021   Asthma 12/30/2021   Flat foot 12/30/2021   Counseling for parent-child problem 12/30/2021   Myopia 12/30/2021   Pain in joint, lower leg [id number]   Plica syndrome [id number]    ONSET DATE: ***  REFERRING DIAG: ***  THERAPY DIAG:  No diagnosis found.  Rationale for Evaluation and Treatment: {HABREHAB:27488}  SUBJECTIVE:                                                                                                                                                                                             SUBJECTIVE STATEMENT: *** Pt accompanied by: {accompnied:27141}  PERTINENT HISTORY: ***  PAIN:  Are you having pain? {OPRCPAIN:27236}  PRECAUTIONS: {Therapy precautions:24002}  WEIGHT BEARING RESTRICTIONS: {Yes ***/No:24003}  FALLS: Has patient fallen in last 6 months? {fallsyesno:27318}  LIVING ENVIRONMENT: Lives with: {OPRC lives with:25569::"lives with their family"} Lives in: {Lives in:25570} Stairs: {opstairs:27293} Has following equipment at  home: {Assistive devices:23999}  PLOF: {PLOF:24004}  PATIENT GOALS: ***  OBJECTIVE:   DIAGNOSTIC FINDINGS: ***  COGNITION: Overall cognitive status: {cognition:24006}   SENSATION: {sensation:27233}  COORDINATION: ***  EDEMA:  {edema:24020}  MUSCLE TONE: {LE tone:25568}  MUSCLE LENGTH: Hamstrings: Right *** deg; Left *** deg Thomas test: Right *** deg; Left *** deg  DTRs:  {DTR SITE:24025}  POSTURE: {posture:25561}  LOWER EXTREMITY ROM:     {AROM/PROM:27142}  Right Eval Left Eval  Hip flexion    Hip extension    Hip abduction    Hip adduction    Hip internal rotation    Hip external rotation    Knee flexion    Knee extension    Ankle dorsiflexion  Ankle plantarflexion    Ankle inversion    Ankle eversion     (Blank rows = not tested)  LOWER EXTREMITY MMT:    MMT Right Eval Left Eval  Hip flexion    Hip extension    Hip abduction    Hip adduction    Hip internal rotation    Hip external rotation    Knee flexion    Knee extension    Ankle dorsiflexion    Ankle plantarflexion    Ankle inversion    Ankle eversion    (Blank rows = not tested)  BED MOBILITY:  {Bed mobility:24027}  TRANSFERS: Assistive device utilized: {Assistive devices:23999}  Sit to stand: {Levels of assistance:24026} Stand to sit: {Levels of assistance:24026} Chair to chair: {Levels of assistance:24026} Floor: {Levels of assistance:24026}  RAMP:  Level of Assistance: {Levels of assistance:24026} Assistive device utilized: {Assistive devices:23999} Ramp Comments: ***  CURB:  Level of Assistance: {Levels of assistance:24026} Assistive device utilized: {Assistive devices:23999} Curb Comments: ***  STAIRS: Level of Assistance: {Levels of assistance:24026} Stair Negotiation Technique: {Stair Technique:27161} with {Rail Assistance:27162} Number of Stairs: ***  Height of Stairs: ***  Comments: ***  GAIT: Gait pattern: {gait characteristics:25376} Distance  walked: *** Assistive device utilized: {Assistive devices:23999} Level of assistance: {Levels of assistance:24026} Comments: ***  FUNCTIONAL TESTS:  {Functional tests:24029}  PATIENT SURVEYS:  {rehab surveys:24030}  TODAY'S TREATMENT:                                                                                                                              DATE: ***    PATIENT EDUCATION: Education details: *** Person educated: {Person educated:25204} Education method: {Education Method:25205} Education comprehension: {Education Comprehension:25206}  HOME EXERCISE PROGRAM: ***  GOALS: Goals reviewed with patient? {yes/no:20286}  SHORT TERM GOALS: Target date: ***  *** Baseline: Goal status: {GOALSTATUS:25110}  2.  *** Baseline:  Goal status: {GOALSTATUS:25110}  3.  *** Baseline:  Goal status: {GOALSTATUS:25110}  4.  *** Baseline:  Goal status: {GOALSTATUS:25110}  5.  *** Baseline:  Goal status: {GOALSTATUS:25110}  6.  *** Baseline:  Goal status: {GOALSTATUS:25110}  LONG TERM GOALS: Target date: ***  *** Baseline:  Goal status: {GOALSTATUS:25110}  2.  *** Baseline:  Goal status: {GOALSTATUS:25110}  3.  *** Baseline:  Goal status: {GOALSTATUS:25110}  4.  *** Baseline:  Goal status: {GOALSTATUS:25110}  5.  *** Baseline:  Goal status: {GOALSTATUS:25110}  6.  *** Baseline:  Goal status: {GOALSTATUS:25110}  ASSESSMENT:  CLINICAL IMPRESSION: Patient is a *** y.o. *** who was seen today for physical therapy evaluation and treatment for ***.   OBJECTIVE IMPAIRMENTS: {opptimpairments:25111}.   ACTIVITY LIMITATIONS: {activitylimitations:27494}  PARTICIPATION LIMITATIONS: {participationrestrictions:25113}  PERSONAL FACTORS: {Personal factors:25162} are also affecting patient's functional outcome.   REHAB POTENTIAL: {rehabpotential:25112}  CLINICAL DECISION MAKING: {clinical decision making:25114}  EVALUATION COMPLEXITY: {Evaluation  complexity:25115}  PLAN:  PT FREQUENCY: {rehab frequency:25116}  PT DURATION: {rehab duration:25117}  PLANNED INTERVENTIONS: {rehab planned interventions:25118::"Therapeutic exercises","Therapeutic activity","Neuromuscular re-education","Balance  training","Gait training","Patient/Family education","Self Care","Joint mobilization"}  PLAN FOR NEXT SESSION: ***   [place], Student-PT 12/22/2022, 7:58 AM

## 2022-12-22 NOTE — Therapy (Signed)
OUTPATIENT PHYSICAL THERAPY CERVICAL EVALUATION   Patient Name: Tracey Cervantes MRN: [id number] DOB:[birth year], 32 y.o., female Today's Date: 12/22/2022  END OF SESSION:  PT End of Session - 12/22/22 0852     Visit Number 1    Number of Visits 17    Date for PT Re-Evaluation 01/19/23    PT Start Time 0755    PT Stop Time 0830    PT Time Calculation (min) 35 min    Activity Tolerance Patient tolerated treatment well    Behavior During Therapy [facility] for tasks assessed/performed             Past Medical History:  Diagnosis Date   ADHD    Allergy    Anxiety    Back pain    Depression    Past Surgical History:  Procedure Laterality Date   ARTHROSCOPIC REPAIR ACL Right    Patient Active Problem List   Diagnosis Date Noted   Symptomatic mammary hypertrophy 11/12/2022   Back pain 11/12/2022   Neck pain 11/12/2022   Class 3 severe obesity due to excess calories with serious comorbidity and body mass index (BMI) of 40.0 to 44.9 in adult ([place]) 09/30/2022   Mild episode of recurrent major depressive disorder ([place]) 09/30/2022   GAD (generalized anxiety disorder) 09/30/2022   Attention deficit hyperactivity disorder (ADHD), combined type 09/30/2022   Allergic rhinitis due to pollen 12/30/2021   Asthma 12/30/2021   Flat foot 12/30/2021   Counseling for parent-child problem 12/30/2021   Myopia 12/30/2021   Pain in joint, lower leg [id number]   Plica syndrome [id number]    PCP: Bo Merino, FNP  REFERRING PROVIDER: Wallace Going, DO  REFERRING DIAG: Chronic bilateral thoracic back pain; neck pain  Rationale for Evaluation and Treatment: Rehabilitation  THERAPY DIAG:  Pain in thoracic spine Cervicalgia  ONSET DATE: Around 2019  SUBJECTIVE:                                                                                                                                                                                           SUBJECTIVE STATEMENT: Chronic  thoracic back pain with secondary bilat cervicalgia  PERTINENT HISTORY:  Pt is a 32 y.o. female who presents with chronic thoracic back pain with secondary bilat cervicalgia that started around 2019. Since onset the pt reports her pain has gradually gotten worse. Pt seeking breast reduction with Dr. Marla Roe, Dr. Jacquelynn Cree notes that this would be helpful to bra strap pain. Pt states her pain originates in the suboccipital region and stops in the upper thoracic region. No distal sx. No N/T. Imaging is not available at this  time. Pt describes pain as an achy soreness. Worst pain is 6/10 NPRS. Best pain is 2/10 NPRS. Currently pain is 5/10 NPRS. Pt reports finding relief with anti-inflammatories, heat packs, and movement. Pt has found that some of the  Pt states the pain gradually increases throughout the course of the day. Pt reports her thoracic back pain is more "tense and tight" in the morning and varies in intensity depending on what she did the day before. Once she gets moving it feels better and it ebbs and flows during the work day. At night, after her workout class her pain comes back on. Pt able to sleep through the night with no issues. Pt works full-time at [employer] in [employer], and works at her computer for most of the day. Pt is an avid exerciser and works out at [employer] 3-4x/week. PMH: DDD, and underwent two surgeries in L knee. Pt denies N/V, B&B changes, unexplained weight fluctuation, saddle paresthesia, fever, night sweats, or unrelenting night pain at this time.   PAIN:  Are you having pain? Yes: NPRS scale: C - 5/10; B - 2/10; W - 6/10 Pain location: Bilat suboccipital region to upper thoracic region Pain description: Achy soreness Aggravating factors: Working out, prolonged sitting  Relieving factors: Movement, heat pack, anti-inflammatories   PRECAUTIONS: None  WEIGHT BEARING RESTRICTIONS: No  FALLS:  Has patient fallen in last 6 months? 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  PATIENT SURVEYS:  FOTO 57  COGNITION: Overall cognitive status: Within functional limits for tasks assessed  SENSATION: WFL  POSTURE: rounded shoulders, forward head, and increased lumbar lordosis  PALPATION: Tenderness in bilat suboccipital muscles, UT/LS, and rhomboids; muscle tension in UT and stops at T6  CERVICAL ROM:   Active ROM A/PROM (deg) eval  Flexion 45  Extension 35  Right lateral flexion 45  Left lateral flexion 45  Right rotation 60  Left rotation 60   (Blank rows = not tested)  Passive ROM A/PROM (deg) eval  Flexion WNL; concordant pain bilaterally  Extension WNL  Right lateral flexion WNL; concordant pain on LUE  Left lateral flexion WNL  Right rotation WNL  Left rotation WNL   (Blank rows = not tested)  Cervical PROM:  - Flexion-rotation: L - WNL with concordant pain on LUE R - unremarkable  CPAs/UPAs: unremarkable   MT: - Sub occipital release: relieved concordant pain - Cervical traction: relieved concordant pain   UPPER EXTREMITY ROM:  Active ROM Right eval Left eval  Shoulder flexion WNL WNL  Shoulder extension WNL WNL  Shoulder abduction WNL WNL  Shoulder adduction    Shoulder extension    Shoulder internal rotation    Shoulder external rotation    Elbow flexion    Elbow extension    Wrist flexion    Wrist extension    Wrist ulnar deviation    Wrist radial deviation    Wrist pronation    Wrist supination     (Blank rows = not tested)  UPPER EXTREMITY MMT:  MMT Right eval Left eval  Shoulder flexion 5 5  Shoulder extension 4 4  Shoulder abduction 3+ 3+  Shoulder adduction    Shoulder extension  Shoulder internal rotation    Shoulder external rotation    Middle trapezius 3+ 3+  Lower trapezius 3+ 3+  Elbow flexion    Elbow extension    Wrist flexion    Wrist extension    Wrist ulnar deviation    Wrist radial deviation    Wrist pronation    Wrist supination    Grip strength     (Blank rows = not tested)  CERVICAL SPECIAL TESTS:  Neck flexor muscle endurance test: Positive; 18 secs, ceased due bilat DNF muscular fatigue with secondary RUE concordant shoulder pain  Flexion-rotation test: Negative for cervicogenic headaches  FUNCTIONAL TESTS:  Not indicated  TODAY'S TREATMENT:                                                                                                                              DATE: 12/22/22  Therex: - UT stretch 1 x 30 sec holds - LS stretch 1 x 30 secs holds - Suboccipital release stretch with 1 x 30 sec hold  PATIENT EDUCATION:  Education details: Pt was educated on diagnosis, anatomy and pathology involved, prognosis, role of PT, and was given an HEP, demonstrating exercise with proper form and technique following demonstration, VC, and TC, and was given a paper hand out to continue exercise at home. Pt was educated on and agreed to [place]. Person educated: Patient Education method: Explanation, Demonstration, Tactile cues, Verbal cues, and Handouts Education comprehension: verbalized understanding, returned demonstration, verbal cues required, and tactile cues required  HOME EXERCISE PROGRAM: Pt reviewed the following HEP with pt and was able to demonstrate a set of the following with min cuing for correction needed. PT educated pt on parameters of therex (how/when to inc/decrease intensity, frequency, rep/set range, stretch hold time, and purpose of therex) with verbalized understanding.    - UT stretch 3 x 30 sec holds (2-3x/day)  - LS stretch 3 x 30 sec holds (2-3x/day) - Suboccipital release stretch 3 x 30 sec holds  (2-3x/day)  ASSESSMENT:  CLINICAL IMPRESSION: Patient is a 32 y.o. female who was seen today for physical therapy evaluation and treatment for chronic thoracic back pain and secondary cervicalgia. Impairments in decreased deep neck flexor endurance, abnormal posture, decreased bilat suboccipital and periscapular strength, soft tissue tension, abnormal posture, and pain. Activity limitations in desk job duties (sitting, typing, reading), carrying/limiting, pulling/pushing, which is inhibiting full participation in her occupation, ADLs and workout regimen. Pt would benefit from skilled PT to address aforementioned deficits and promote optimal return to PLOF and improve QoL.   OBJECTIVE IMPAIRMENTS: decreased endurance, decreased strength, and pain.  ACTIVITY LIMITATIONS:  Desk job duties  PARTICIPATION LIMITATIONS:  Completing workout regimen, occupation  PERSONAL FACTORS: None   REHAB POTENTIAL: Excellent  CLINICAL DECISION MAKING: Evolving/moderate complexity  EVALUATION COMPLEXITY: Moderate   GOALS: Goals reviewed with patient? Yes  SHORT TERM GOALS: Target date: 01/19/23  Pt will execute HEP independently with proper form and technique  in order to return to PLOF at home Baseline: HEP given on 12/22/22 Goal status: INITIAL   LONG TERM GOALS: Target date: 02/16/23  Pt will increase FOTO score to 71 to demonstrate predicted increase in functional mobility to complete ADLs Baseline: 57 Goal status: INITIAL   2.  Pt will decrease worst pain as reported on NPRS by at least 3 points in order to demonstrate clinically significant reduction in pain. Baseline: NPRS - 6/10 Goal status: INITIAL  3.  Pt will demonstrate periscapular strength of 4/5 in order to demonstrate strength needed for ADLs Baseline: 3+/5 Goal status: INITIAL  4.  Pt will complete DNF test of 29 secs to demonstrate a clinically significant increase in strength needed to complete ADLs Baseline: 12/22/22: 18  secs Goal status: INITIAL  5. Pt will demonstrate shoulder strength of 4/5 in order to demonstrate strength needed for ADLs Baseline: 12/22/22: 3+/5 abd Goal status: INITIAL    PLAN:  PT FREQUENCY: 2x/week  PT DURATION: 8 weeks  PLANNED INTERVENTIONS: Therapeutic exercises, Therapeutic activity, Neuromuscular re-education, Self Care, Joint mobilization, Dry Needling, and Manual therapy  PLAN FOR NEXT SESSION: Update HEP. Educate pt on TPDN for muscle tension.  Durwin Reges DPT Durwin Reges, PT 12/22/2022, 2:58 PM

## 2022-12-23 ENCOUNTER — Telehealth: Payer: Self-pay | Admitting: Nurse Practitioner

## 2022-12-23 ENCOUNTER — Ambulatory Visit: Payer: BC Managed Care – PPO

## 2022-12-23 DIAGNOSIS — F4312 Post-traumatic stress disorder, chronic: Secondary | ICD-10-CM | POA: Diagnosis not present

## 2022-12-23 NOTE — Telephone Encounter (Signed)
Copied from [place] [id number]. Topic: General - Other >> Dec 23, 2022 11:32 AM Everette C wrote: Reason for CRM: The patient has called to follow up on the completion of their prior authorization for Liraglutide -Weight Management ([place]) 18 MG/3ML SOPN [id number]  Please contact the patient further when possible regarding their previously discussed prior authorization

## 2022-12-24 NOTE — Telephone Encounter (Signed)
PA has been done. Waiting on approval or denial back.

## 2022-12-27 ENCOUNTER — Ambulatory Visit: Payer: BC Managed Care – PPO | Admitting: Physical Therapy

## 2022-12-28 ENCOUNTER — Ambulatory Visit: Payer: BC Managed Care – PPO | Attending: Plastic Surgery | Admitting: Physical Therapy

## 2022-12-28 ENCOUNTER — Encounter: Payer: Self-pay | Admitting: Physical Therapy

## 2022-12-28 DIAGNOSIS — M546 Pain in thoracic spine: Secondary | ICD-10-CM | POA: Diagnosis not present

## 2022-12-28 DIAGNOSIS — M542 Cervicalgia: Secondary | ICD-10-CM | POA: Insufficient documentation

## 2022-12-28 NOTE — Therapy (Signed)
OUTPATIENT PHYSICAL THERAPY CERVICAL EVALUATION   Patient Name: Tracey Cervantes MRN: [id number] DOB:[birth year], 32 y.o., female Today's Date: 12/28/2022  END OF SESSION:  PT End of Session - 12/28/22 0836     Visit Number 2    Number of Visits 17    Date for PT Re-Evaluation 01/19/23    Authorization - Visit Number 1    Authorization - Number of Visits 10    Progress Note Due on Visit 10    PT Start Time 0830    PT Stop Time 0910    PT Time Calculation (min) 40 min    Activity Tolerance Patient tolerated treatment well    Behavior During Therapy [facility] for tasks assessed/performed              Past Medical History:  Diagnosis Date   ADHD    Allergy    Anxiety    Back pain    Depression    Past Surgical History:  Procedure Laterality Date   ARTHROSCOPIC REPAIR ACL Right    Patient Active Problem List   Diagnosis Date Noted   Symptomatic mammary hypertrophy 11/12/2022   Back pain 11/12/2022   Neck pain 11/12/2022   Class 3 severe obesity due to excess calories with serious comorbidity and body mass index (BMI) of 40.0 to 44.9 in adult ([place]) 09/30/2022   Mild episode of recurrent major depressive disorder ([place]) 09/30/2022   GAD (generalized anxiety disorder) 09/30/2022   Attention deficit hyperactivity disorder (ADHD), combined type 09/30/2022   Allergic rhinitis due to pollen 12/30/2021   Asthma 12/30/2021   Flat foot 12/30/2021   Counseling for parent-child problem 12/30/2021   Myopia 12/30/2021   Pain in joint, lower leg [id number]   Plica syndrome [id number]    PCP: Bo Merino, FNP  REFERRING PROVIDER: Wallace Going, DO  REFERRING DIAG: Chronic bilateral thoracic back pain; neck pain  Rationale for Evaluation and Treatment: Rehabilitation  THERAPY DIAG:  Pain in thoracic spine Cervicalgia  ONSET DATE: Around 2019  SUBJECTIVE:                                                                                                                                                                                            SUBJECTIVE STATEMENT: Pt reports some increased UT tension following 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 Reports 4/10 pain today.  PERTINENT HISTORY:  Pt is a 32 y.o. female who presents with chronic thoracic back pain with secondary bilat cervicalgia that started around 2019. Since onset the pt reports her pain has gradually gotten worse. Pt seeking breast  reduction with Dr. Marla Roe, Dr. Jacquelynn Cree notes that this would be helpful to bra strap pain. Pt states her pain originates in the suboccipital region and stops in the upper thoracic region. No distal sx. No N/T. Imaging is not available at this time. Pt describes pain as an achy soreness. Worst pain is 6/10 NPRS. Best pain is 2/10 NPRS. Currently pain is 5/10 NPRS. Pt reports finding relief with anti-inflammatories, heat packs, and movement. Pt has found that some of the  Pt states the pain gradually increases throughout the course of the day. Pt reports her thoracic back pain is more "tense and tight" in the morning and varies in intensity depending on what she did the day before. Once she gets moving it feels better and it ebbs and flows during the work day. At night, after her workout class her pain comes back on. Pt able to sleep through the night with no issues. Pt works full-time at [employer] in [employer], and works at her computer for most of the day. Pt is an avid exerciser and works out at [employer] 3-4x/week. PMH: DDD, and underwent two surgeries in L knee. Pt denies N/V, B&B changes, unexplained weight fluctuation, saddle paresthesia, fever, night sweats, or unrelenting night pain at this time.   PAIN:  Are you having pain? Yes: NPRS scale: C - 5/10; B - 2/10; W - 6/10 Pain location: Bilat suboccipital region to upper thoracic region Pain description: Achy soreness Aggravating factors: Working out, prolonged sitting   Relieving factors: Movement, heat pack, anti-inflammatories   PRECAUTIONS: None  WEIGHT BEARING RESTRICTIONS: No  FALLS:  Has patient fallen in last 6 months? 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  PATIENT SURVEYS:  FOTO 57  COGNITION: Overall cognitive status: Within functional limits for tasks assessed  SENSATION: WFL  POSTURE: rounded shoulders, forward head, and increased lumbar lordosis  PALPATION: Tenderness in bilat suboccipital muscles, UT/LS, and rhomboids; muscle tension in UT and stops at T6  CERVICAL ROM:   Active ROM A/PROM (deg) eval  Flexion 45  Extension 35  Right lateral flexion 45  Left lateral flexion 45  Right rotation 60  Left rotation 60   (Blank rows = not tested)  Passive ROM A/PROM (deg) eval  Flexion WNL; concordant pain bilaterally  Extension WNL  Right lateral flexion WNL; concordant pain on LUE  Left lateral flexion WNL  Right rotation WNL  Left rotation WNL   (Blank rows = not tested)  Cervical PROM:  - Flexion-rotation: L - WNL with concordant pain on LUE R - unremarkable  CPAs/UPAs: unremarkable   MT: - Sub occipital release: relieved concordant pain - Cervical traction: relieved concordant pain   UPPER EXTREMITY ROM:  Active ROM Right eval Left eval  Shoulder flexion WNL WNL  Shoulder extension WNL WNL  Shoulder abduction WNL WNL  Shoulder adduction    Shoulder extension    Shoulder internal rotation    Shoulder external rotation    Elbow flexion    Elbow extension    Wrist flexion    Wrist extension    Wrist ulnar deviation    Wrist radial deviation    Wrist pronation    Wrist supination     (  Blank rows = not  tested)  UPPER EXTREMITY MMT:  MMT Right eval Left eval  Shoulder flexion 5 5  Shoulder extension 4 4  Shoulder abduction 3+ 3+  Shoulder adduction    Shoulder extension    Shoulder internal rotation    Shoulder external rotation    Middle trapezius 3+ 3+  Lower trapezius 3+ 3+  Elbow flexion    Elbow extension    Wrist flexion    Wrist extension    Wrist ulnar deviation    Wrist radial deviation    Wrist pronation    Wrist supination    Grip strength     (Blank rows = not tested)  CERVICAL SPECIAL TESTS:  Neck flexor muscle endurance test: Positive; 18 secs, ceased due bilat DNF muscular fatigue with secondary RUE concordant shoulder pain  Flexion-rotation test: Negative for cervicogenic headaches  FUNCTIONAL TESTS:  Not indicated  TODAY'S TREATMENT:                                                                                                                              DATE: 12/28/22  Nustep seat 8 UE 12 L3 13mns for gentle strengthening pro/retraction  Post shoulder rolls x12 Low row BluTB 2x 12 with good carry over following initial cuing of technique Prone Y 2x 12 with cuing for initial set up of scap retraction + depression focus with good carry over - UT stretch x30secH bilat - LS stretch x30secH bilat - Suboccipital stretch x30secH   Manual STM with trigger point release  to bilat UT and levator scap Following: Dry Needling: (2/2) 322m.25 needles placed along bilat UT to decrease increased muscular spasms and trigger points with the patient positioned in supine. Patient was educated on risks and benefits of therapy and verbally consents to PT.   Manual cervical traction 10sec on/of x12  PATIENT EDUCATION:  Education details: Pt was educated on diagnosis, anatomy and pathology involved, prognosis, role of PT, and was given an HEP, demonstrating exercise with proper form and technique following demonstration, VC, and TC, and was given a paper hand out to  continue exercise at home. Pt was educated on and agreed to [place] educated: Patient Education method: Explanation, Demonstration, Tactile cues, Verbal cues, and Handouts Education comprehension: verbalized understanding, returned demonstration, verbal cues required, and tactile cues required  HOME EXERCISE PROGRAM: Pt reviewed the following HEP with pt and was able to demonstrate a set of the following with min cuing for correction needed. PT educated pt on parameters of therex (how/when to inc/decrease intensity, frequency, rep/set range, stretch hold time, and purpose of therex) with verbalized understanding.    - UT stretch 3 x 30 sec holds (2-3x/day)  - LS stretch 3 x 30 sec holds (2-3x/day) - Suboccipital release stretch 3 x 30 sec holds (2-3x/day)  ASSESSMENT:  CLINICAL IMPRESSION: PT initiated manual techniques with TPDN adjunct in order to reduce muscle tension and pain; and therex  for increased periscapular strength and motor control of scapulohumeral rhythm with success. Patient is able to comply with all cuing for proper technique of therex with excellent carry over and good motivation throughout session. Pt with predicted soreness from TPDN post session, no increased pain throughout session. Would benefit from skilled PT to address above deficits and promote optimal return to PLOF.   OBJECTIVE IMPAIRMENTS: decreased endurance, decreased strength, and pain.  ACTIVITY LIMITATIONS:  Desk job duties  PARTICIPATION LIMITATIONS:  Completing workout regimen, occupation  PERSONAL FACTORS: None   REHAB POTENTIAL: Excellent  CLINICAL DECISION MAKING: Evolving/moderate complexity  EVALUATION COMPLEXITY: Moderate   GOALS: Goals reviewed with patient? Yes  SHORT TERM GOALS: Target date: 01/19/23  Pt will execute HEP independently with proper form and technique in order to return to PLOF at home Baseline: HEP given on 12/22/22 Goal status: INITIAL   LONG TERM GOALS:  Target date: 02/16/23  Pt will increase FOTO score to 71 to demonstrate predicted increase in functional mobility to complete ADLs Baseline: 57 Goal status: INITIAL   2.  Pt will decrease worst pain as reported on NPRS by at least 3 points in order to demonstrate clinically significant reduction in pain. Baseline: NPRS - 6/10 Goal status: INITIAL  3.  Pt will demonstrate periscapular strength of 4/5 in order to demonstrate strength needed for ADLs Baseline: 3+/5 Goal status: INITIAL  4.  Pt will complete DNF test of 29 secs to demonstrate a clinically significant increase in strength needed to complete ADLs Baseline: 12/22/22: 18 secs Goal status: INITIAL  5. Pt will demonstrate shoulder strength of 4/5 in order to demonstrate strength needed for ADLs Baseline: 12/22/22: 3+/5 abd Goal status: INITIAL    PLAN:  PT FREQUENCY: 2x/week  PT DURATION: 8 weeks  PLANNED INTERVENTIONS: Therapeutic exercises, Therapeutic activity, Neuromuscular re-education, Self Care, Joint mobilization, Dry Needling, and Manual therapy  PLAN FOR NEXT SESSION: Update HEP. Educate pt on TPDN for muscle tension.  Durwin Reges DPT Durwin Reges, PT 12/28/2022, 9:14 AM

## 2022-12-29 ENCOUNTER — Ambulatory Visit: Payer: BC Managed Care – PPO | Admitting: Physical Therapy

## 2023-01-03 ENCOUNTER — Encounter (INDEPENDENT_AMBULATORY_CARE_PROVIDER_SITE_OTHER): Payer: BC Managed Care – PPO | Admitting: Family Medicine

## 2023-01-03 ENCOUNTER — Ambulatory Visit: Payer: BC Managed Care – PPO

## 2023-01-03 DIAGNOSIS — M546 Pain in thoracic spine: Secondary | ICD-10-CM | POA: Diagnosis not present

## 2023-01-03 DIAGNOSIS — F4312 Post-traumatic stress disorder, chronic: Secondary | ICD-10-CM | POA: Diagnosis not present

## 2023-01-03 DIAGNOSIS — M542 Cervicalgia: Secondary | ICD-10-CM | POA: Diagnosis not present

## 2023-01-03 NOTE — Therapy (Signed)
OUTPATIENT PHYSICAL THERAPY TREATMENT   Patient Name: Tracey Cervantes MRN: [id number] DOB:02-28-1991, 32 y.o., female Today's Date: 01/03/2023  END OF SESSION:  PT End of Session - 01/03/23 0755     Visit Number 3    Number of Visits 17    Date for PT Re-Evaluation 02/25/23    Authorization Type [employer] COMM Pro    Authorization Time Period 12/22/22-02/25/23    Progress Note Due on Visit 10    PT Start Time 0850    PT Stop Time 0930    PT Time Calculation (min) 40 min    Activity Tolerance Patient tolerated treatment well;No increased pain    Behavior During Therapy [facility] for tasks assessed/performed              Past Medical History:  Diagnosis Date   ADHD    Allergy    Anxiety    Back pain    Depression    Past Surgical History:  Procedure Laterality Date   ARTHROSCOPIC REPAIR ACL Right    Patient Active Problem List   Diagnosis Date Noted   Symptomatic mammary hypertrophy 11/12/2022   Back pain 11/12/2022   Neck pain 11/12/2022   Class 3 severe obesity due to excess calories with serious comorbidity and body mass index (BMI) of 40.0 to 44.9 in adult [facility]) 09/30/2022   Mild episode of recurrent major depressive disorder ([place]) 09/30/2022   GAD (generalized anxiety disorder) 09/30/2022   Attention deficit hyperactivity disorder (ADHD), combined type 09/30/2022   Allergic rhinitis due to pollen 12/30/2021   Asthma 12/30/2021   Flat foot 12/30/2021   Counseling for parent-child problem 12/30/2021   Myopia 12/30/2021   Pain in joint, lower leg [id number]   Plica syndrome [id number]    PCP: Bo Merino, FNP  REFERRING PROVIDER: Wallace Going, DO  REFERRING DIAG: Chronic bilateral thoracic back pain; neck pain  Rationale for Evaluation and Treatment: Rehabilitation  THERAPY DIAG:  Pain in thoracic spine Cervicalgia  ONSET DATE: Around 2019  SUBJECTIVE:                                                                                                                                                                                            SUBJECTIVE STATEMENT: Pt still pretty sore from long day working Friday. HEP going well in general, gelt more restricted.   PERTINENT HISTORY:  Pt is a 32 y.o. female who presents with chronic thoracic back pain with secondary bilat cervicalgia that started around 2019. Since onset the pt reports her pain has gradually gotten worse. Pt seeking breast reduction with Dr. Marla Roe, Dr. Jacquelynn Cree notes  that this would be helpful to bra strap pain. Pt states her pain originates in the suboccipital region and stops in the upper thoracic region. No distal sx. No N/T. Imaging is not available at this time. Pt describes pain as an achy soreness. Worst pain is 6/10 NPRS. Best pain is 2/10 NPRS. Currently pain is 5/10 NPRS. Pt reports finding relief with anti-inflammatories, heat packs, and movement. Pt has found that some of the  Pt states the pain gradually increases throughout the course of the day. Pt reports her thoracic back pain is more "tense and tight" in the morning and varies in intensity depending on what she did the day before. Once she gets moving it feels better and it ebbs and flows during the work day. At night, after her workout class her pain comes back on. Pt able to sleep through the night with no issues. Pt works full-time at [employer] in [employer], and works at her computer for most of the day. Pt is an avid exerciser and works out at [employer] 3-4x/week. PMH: DDD, and underwent two surgeries in L knee. Pt denies N/V, B&B changes, unexplained weight fluctuation, saddle paresthesia, fever, night sweats, or unrelenting night pain at this time.   PAIN:  Are you having pain? Yes 5/10   PRECAUTIONS: None  WEIGHT BEARING RESTRICTIONS: No  FALLS:  Has patient fallen in last 6 months? No    PATIENT GOALS: Manage and reduce pain   NEXT MD VISIT:   OBJECTIVE:    TODAY'S TREATMENT:                                                                                                                               DATE: 12/28/22  -Nustep seat 8 UE 11 L1  x4 minutes, towel roll between shoulder blades -UBE 60 level 3, 60sec FWD, 60sec back, 60sec FWD   -3 minutes slow foam rolling of T spine, arms in self hug pose -Hooklying T pose pec stretch on vertical towel roll 2x60sec -hooklying on Towel roll cervical retraction into 3 pillows to neutral 15x5sec -Book openings 1x12 bilat x3 sec H   - seated suboccipital stretch 3x30sec  - middle trap stretch seated 2x30sec - kneeing prayer stretch 1x30sec   -green T band horizonal ABDCT 1x12, cues for scap retraction     PATIENT EDUCATION:  Education details: Pt was educated on diagnosis, anatomy and pathology involved, prognosis, role of PT, and was given an HEP, demonstrating exercise with proper form and technique following demonstration, VC, and TC, and was given a paper hand out to continue exercise at home. Pt was educated on and agreed to [place]. Person educated: Patient Education method: Explanation, Demonstration, Tactile cues, Verbal cues, and Handouts Education comprehension: verbalized understanding, returned demonstration, verbal cues required, and tactile cues required  HOME EXERCISE PROGRAM: Pt reviewed the following HEP with pt and was able to demonstrate a set of the following with min  cuing for correction needed. PT educated pt on parameters of therex (how/when to inc/decrease intensity, frequency, rep/set range, stretch hold time, and purpose of therex) with verbalized understanding.    - UT stretch 3 x 30 sec holds (2-3x/day)  - LS stretch 3 x 30 sec holds (2-3x/day) - Suboccipital release stretch 3 x 30 sec holds (2-3x/day)  ASSESSMENT:  CLINICAL IMPRESSION: Continued with postural ROM work and strengthening interventions. Pain aggravated from previous week, but not worse in session. Spent lots of time on mobility  work, but Chiropractor out of time for additional [employer] work. Would benefit from skilled PT to address above deficits and promote optimal return to PLOF.    OBJECTIVE IMPAIRMENTS: decreased endurance, decreased strength, and pain.  ACTIVITY LIMITATIONS:  Desk job duties  PARTICIPATION LIMITATIONS:  Completing workout regimen, occupation  PERSONAL FACTORS: None   REHAB POTENTIAL: Excellent  CLINICAL DECISION MAKING: Evolving/moderate complexity  EVALUATION COMPLEXITY: Moderate   GOALS: Goals reviewed with patient? Yes  SHORT TERM GOALS: Target date: 01/19/23  Pt will execute HEP independently with proper form and technique in order to return to PLOF at home Baseline: HEP given on 12/22/22 Goal status: INITIAL   LONG TERM GOALS: Target date: 02/16/23  Pt will increase FOTO score to 71 to demonstrate predicted increase in functional mobility to complete ADLs Baseline: 57 Goal status: INITIAL  2.  Pt will decrease worst pain as reported on NPRS by at least 3 points in order to demonstrate clinically significant reduction in pain. Baseline: NPRS - 6/10 Goal status: INITIAL  3.  Pt will demonstrate periscapular strength of 4/5 in order to demonstrate strength needed for ADLs Baseline: 3+/5 Goal status: INITIAL  4.  Pt will complete DNF test of 29 secs to demonstrate a clinically significant increase in strength needed to complete ADLs Baseline: 12/22/22: 18 secs Goal status: INITIAL  5. Pt will demonstrate shoulder strength of 4/5 in order to demonstrate strength needed for ADLs Baseline: 12/22/22: 3+/5 abd Goal status: INITIAL    PLAN:  PT FREQUENCY: 2x/week  PT DURATION: 8 weeks  PLANNED INTERVENTIONS: Therapeutic exercises, Therapeutic activity, Neuromuscular re-education, Self Care, Joint mobilization, Dry Needling, and Manual therapy  PLAN FOR NEXT SESSION: Continue with ROM and strength interventions   Durwin Reges DPT Tracey Cervantes,  PT 01/03/2023, 8:00 AM

## 2023-01-05 ENCOUNTER — Ambulatory Visit: Payer: BC Managed Care – PPO | Admitting: Physical Therapy

## 2023-01-05 ENCOUNTER — Encounter: Payer: Self-pay | Admitting: Physical Therapy

## 2023-01-05 DIAGNOSIS — M542 Cervicalgia: Secondary | ICD-10-CM

## 2023-01-05 DIAGNOSIS — M546 Pain in thoracic spine: Secondary | ICD-10-CM

## 2023-01-05 NOTE — Therapy (Signed)
OUTPATIENT PHYSICAL THERAPY TREATMENT   Patient Name: Tracey Cervantes MRN: [id number] DOB:[birth year], 32 y.o., female Today's Date: 01/05/2023  END OF SESSION:  PT End of Session - 01/05/23 0754     Visit Number 4    Number of Visits 17    Date for PT Re-Evaluation 02/25/23    Authorization Type [employer]BS COMM Pro    Authorization Time Period 12/22/22-02/25/23    Authorization - Visit Number 4    Authorization - Number of Visits 10    Progress Note Due on Visit 10    PT Start Time 0750    PT Stop Time 0829    PT Time Calculation (min) 39 min    Activity Tolerance Patient tolerated treatment well;No increased pain    Behavior During Therapy [facility] for tasks assessed/performed               Past Medical History:  Diagnosis Date   ADHD    Allergy    Anxiety    Back pain    Depression    Past Surgical History:  Procedure Laterality Date   ARTHROSCOPIC REPAIR ACL Right    Patient Active Problem List   Diagnosis Date Noted   Symptomatic mammary hypertrophy 11/12/2022   Back pain 11/12/2022   Neck pain 11/12/2022   Class 3 severe obesity due to excess calories with serious comorbidity and body mass index (BMI) of 40.0 to 44.9 in adult [facility]) 09/30/2022   Mild episode of recurrent major depressive disorder ([place]) 09/30/2022   GAD (generalized anxiety disorder) 09/30/2022   Attention deficit hyperactivity disorder (ADHD), combined type 09/30/2022   Allergic rhinitis due to pollen 12/30/2021   Asthma 12/30/2021   Flat foot 12/30/2021   Counseling for parent-child problem 12/30/2021   Myopia 12/30/2021   Pain in joint, lower leg [id number]   Plica syndrome [id number]    PCP: Bo Merino, FNP  REFERRING PROVIDER: Wallace Going, DO  REFERRING DIAG: Chronic bilateral thoracic back pain; neck pain  Rationale for Evaluation and Treatment: Rehabilitation  THERAPY DIAG:  Pain in thoracic spine Cervicalgia  ONSET DATE: Around 2019  SUBJECTIVE:                                                                                                                                                                                            SUBJECTIVE STATEMENT: Pt still pretty sore from long day working Friday. HEP going well in general, gelt more restricted.   PERTINENT HISTORY:  Pt is a 32 y.o. female who presents with chronic thoracic back pain with secondary bilat cervicalgia that started around 2019. Since onset the  pt reports her pain has gradually gotten worse. Pt seeking breast reduction with Dr. Marla Roe, Dr. Jacquelynn Cree notes that this would be helpful to bra strap pain. Pt states her pain originates in the suboccipital region and stops in the upper thoracic region. No distal sx. No N/T. Imaging is not available at this time. Pt describes pain as an achy soreness. Worst pain is 6/10 NPRS. Best pain is 2/10 NPRS. Currently pain is 5/10 NPRS. Pt reports finding relief with anti-inflammatories, heat packs, and movement. Pt has found that some of the  Pt states the pain gradually increases throughout the course of the day. Pt reports her thoracic back pain is more "tense and tight" in the morning and varies in intensity depending on what she did the day before. Once she gets moving it feels better and it ebbs and flows during the work day. At night, after her workout class her pain comes back on. Pt able to sleep through the night with no issues. Pt works full-time at [employer] in [employer], and works at her computer for most of the day. Pt is an avid exerciser and works out at [employer] 3-4x/week. PMH: DDD, and underwent two surgeries in L knee. Pt denies N/V, B&B changes, unexplained weight fluctuation, saddle paresthesia, fever, night sweats, or unrelenting night pain at this time.   PAIN:  Are you having pain? Yes 5/10   PRECAUTIONS: None  WEIGHT BEARING RESTRICTIONS: No  FALLS:  Has patient fallen in last 6 months? No    PATIENT GOALS: Manage  and reduce pain   NEXT MD VISIT:   OBJECTIVE:    TODAY'S TREATMENT:                                                                                                                              DATE: 12/28/22  -Nustep seat 8 UE 11 L1  x4 minutes, towel roll between shoulder blades   -3 minutes slow foam rolling of T spine, arms in self hug pose Supine thoracic ext with hands behind head 2x 12  -Hooklying T pose pec stretch on vertical towel roll with upper cervical retraction 2x 12   -blue T band horizonal ABDCT 2x12, cues for scap retraction - BluTB low row 2x 12  Manual STM with trigger point release  to bilat UT and levator scap Following: Dry Needling: (2/2) 27m .25 needles placed along bilat UT to decrease increased muscular spasms and trigger points with the patient positioned in supine. Patient was educated on risks and benefits of therapy and verbally consents to PT.   Manual cervical traction 10sec on/of x12      PATIENT EDUCATION:  Education details: Pt was educated on diagnosis, anatomy and pathology involved, prognosis, role of PT, and was given an HEP, demonstrating exercise with proper form and technique following demonstration, VC, and TC, and was given a paper hand out to continue exercise at home. Pt was educated on  and agreed to POC. Person educated: Patient Education method: Explanation, Demonstration, Tactile cues, Verbal cues, and Handouts Education comprehension: verbalized understanding, returned demonstration, verbal cues required, and tactile cues required  HOME EXERCISE PROGRAM: Pt reviewed the following HEP with pt and was able to demonstrate a set of the following with min cuing for correction needed. PT educated pt on parameters of therex (how/when to inc/decrease intensity, frequency, rep/set range, stretch hold time, and purpose of therex) with verbalized understanding.    - UT stretch 3 x 30 sec holds (2-3x/day)  - LS stretch 3 x 30 sec holds  (2-3x/day) - Suboccipital release stretch 3 x 30 sec holds (2-3x/day)  ASSESSMENT:  CLINICAL IMPRESSION: Continued with postural ROM work and strengthening interventions. Pt continues to report good response to TPDN with paplable decrease in tension/trigger points. Patient is able to comply with all cuing for proper technique of therex with good carry over of all cuing. Good motivation throughout session with no increased pain. Would benefit from skilled PT to address above deficits and promote optimal return to PLOF.    OBJECTIVE IMPAIRMENTS: decreased endurance, decreased strength, and pain.  ACTIVITY LIMITATIONS:  Desk job duties  PARTICIPATION LIMITATIONS:  Completing workout regimen, occupation  PERSONAL FACTORS: None   REHAB POTENTIAL: Excellent  CLINICAL DECISION MAKING: Evolving/moderate complexity  EVALUATION COMPLEXITY: Moderate   GOALS: Goals reviewed with patient? Yes  SHORT TERM GOALS: Target date: 01/19/23  Pt will execute HEP independently with proper form and technique in order to return to PLOF at home Baseline: HEP given on 12/22/22 Goal status: INITIAL   LONG TERM GOALS: Target date: 02/16/23  Pt will increase FOTO score to 71 to demonstrate predicted increase in functional mobility to complete ADLs Baseline: 57 Goal status: INITIAL  2.  Pt will decrease worst pain as reported on NPRS by at least 3 points in order to demonstrate clinically significant reduction in pain. Baseline: NPRS - 6/10 Goal status: INITIAL  3.  Pt will demonstrate periscapular strength of 4/5 in order to demonstrate strength needed for ADLs Baseline: 3+/5 Goal status: INITIAL  4.  Pt will complete DNF test of 29 secs to demonstrate a clinically significant increase in strength needed to complete ADLs Baseline: 12/22/22: 18 secs Goal status: INITIAL  5. Pt will demonstrate shoulder strength of 4/5 in order to demonstrate strength needed for ADLs Baseline: 12/22/22: 3+/5  abd Goal status: INITIAL    PLAN:  PT FREQUENCY: 2x/week  PT DURATION: 8 weeks  PLANNED INTERVENTIONS: Therapeutic exercises, Therapeutic activity, Neuromuscular re-education, Self Care, Joint mobilization, Dry Needling, and Manual therapy  PLAN FOR NEXT SESSION: Continue with ROM and strength interventions   Durwin Reges DPT Durwin Reges, PT 01/05/2023, 10:42 AM

## 2023-01-07 ENCOUNTER — Encounter: Payer: Self-pay | Admitting: Surgical

## 2023-01-07 ENCOUNTER — Ambulatory Visit: Payer: BC Managed Care – PPO | Admitting: Surgical

## 2023-01-07 VITALS — BP 151/79 | HR 87

## 2023-01-07 DIAGNOSIS — N62 Hypertrophy of breast: Secondary | ICD-10-CM | POA: Diagnosis not present

## 2023-01-07 DIAGNOSIS — M542 Cervicalgia: Secondary | ICD-10-CM | POA: Diagnosis not present

## 2023-01-07 DIAGNOSIS — G8929 Other chronic pain: Secondary | ICD-10-CM

## 2023-01-07 DIAGNOSIS — M546 Pain in thoracic spine: Secondary | ICD-10-CM | POA: Diagnosis not present

## 2023-01-07 NOTE — Progress Notes (Signed)
   Referring Provider Bo Merino, [address],  [place] [address]   CC:  Chief Complaint  Patient presents with   Follow-up      Tracey Cervantes is an 32 y.o. female.  HPI: Patient is a 32 y.o. 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 She reports that she has completed 4 visits of physical therapy, reports that she is still having ongoing upper back and shoulder pain.   She reports she has a few more appointments to attend with physical therapy.  She is interested in pursuing surgical intervention for bilateral breast reduction  Review of Systems Musculoskeletal: Positive back and shoulder pain  Physical Exam    01/07/2023    9:57 AM 11/12/2022    8:52 AM 09/30/2022   10:31 AM  Vitals with BMI  Height  5\' 3"  5\' 3"   Weight  241 lbs 239 lbs 3 oz  BMI  123456 [id number]  Systolic [id number]  Diastolic 79 97 72  Pulse 87 86 98    General:  No acute distress,  Alert and oriented, Non-Toxic, Normal speech and affect Psych: Normal behavior and mood    Assessment/Plan Patient is interested in pursuing surgical intervention for bilateral breast reduction. She has completed 4 weeks of physical therapy, she is aware that she will need to complete at least 2 more weeks of physical therapy and have a reevaluation to discuss how she is feeling afterwards.  She is agreeable to this and we will plan to see her in a few weeks to rediscuss how she is feeling after additional physical therapy.   Tracey Cervantes 01/07/2023, 10:01 AM

## 2023-01-10 ENCOUNTER — Encounter: Payer: BC Managed Care – PPO | Admitting: Physical Therapy

## 2023-01-11 NOTE — Progress Notes (Unsigned)
Name: Tracey Cervantes   MRN: [id number]    DOB: 1991/04/07   Date:01/13/2023       Progress Note  Subjective  Chief Complaint  Chief Complaint  Patient presents with   Annual Exam    HPI  Patient presents for annual CPE.  Diet: well balanced diet, she says she does struggle getting protein in Exercise: three to four times a week  Sleep:6- 8 hours Last dental exam:January 2024 Last eye exam: last summer  [facility] from 01/13/2023 in [facility]  AUDIT-C Score 0      Depression: Phq 9 is  positive, she is currently getting treatment from psychiatry.      01/13/2023    9:37 AM 09/30/2022   10:30 AM  Depression screen PHQ 2/9  Decreased Interest 2 1  Down, Depressed, Hopeless 1 1  PHQ - 2 Score 3 2  Altered sleeping 3 3  Tired, decreased energy 3 1  Change in appetite 1 2  Feeling bad or failure about yourself  2 1  Trouble concentrating 3 1  Moving slowly or fidgety/restless 2 0  Suicidal thoughts 0 0  PHQ-9 Score 17 10  Difficult doing work/chores Very difficult Somewhat difficult   Hypertension: BP Readings from Last 3 Encounters:  01/13/23 130/80  01/07/23 (!) 151/79  11/12/22 (!) 161/97   Obesity: Wt Readings from Last 3 Encounters:  01/13/23 238 lb 12.8 oz (108.3 kg)  11/12/22 241 lb (109.3 kg)  09/30/22 239 lb 3.2 oz (108.5 kg)   BMI Readings from Last 3 Encounters:  01/13/23 42.30 kg/m  11/12/22 42.69 kg/m  09/30/22 42.37 kg/m     Vaccines:  HPV: up to at age 65 , ask insurance if age between 37-45  Shingrix: 17-64 yo and ask insurance if covered when patient above 41 yo Pneumonia:  educated and discussed with patient. Flu:  educated and discussed with patient.  Hep C Screening: 09/30/2022 STD testing and prevention (HIV/chl/gon/syphilis): 09/30/2022 Intimate partner violence:none Sexual History : yes, OCP Menstrual History/LMP/Abnormal Bleeding: LMC: current, regular cycle Incontinence Symptoms:  none  Breast cancer:  - Last Mammogram: no concerns, does not qualify - BRCA gene screening: none  Osteoporosis: Discussed high calcium and vitamin D supplementation, weight bearing exercises  Cervical cancer screening: 07/07/2022  Skin cancer: Discussed monitoring for atypical lesions  Colorectal cancer: no concerns, does not qualify   Lung cancer:   Low Dose CT Chest recommended if Age 23-80 years, 20 pack-year currently smoking OR have quit w/in 15years. Patient does not qualify.   ECG: none  Advanced Care Planning: A voluntary discussion about advance care planning including the explanation and discussion of advance directives.  Discussed health care proxy and Living will, and the patient was able to identify a health care proxy as mother.  Patient does not have a living will at present time. If patient does have living will, I have requested they bring this to the clinic to be scanned in to their chart.  Lipids: Lab Results  Component Value Date   CHOL 179 09/30/2022   Lab Results  Component Value Date   HDL 61 09/30/2022   Lab Results  Component Value Date   LDLCALC 102 (H) 09/30/2022   Lab Results  Component Value Date   TRIG 70 09/30/2022   Lab Results  Component Value Date   CHOLHDL 2.9 09/30/2022   No results found for: "LDLDIRECT"  Glucose: Glucose, Bld  Date Value Ref Range Status  09/30/2022 112 (H) 65 - 99 mg/dL Final    Comment:    .            Fasting reference interval . For someone without known diabetes, a glucose value between 100 and 125 mg/dL is consistent with prediabetes and should be confirmed with a follow-up test. .   11/29/2021 141 (H) 70 - 99 mg/dL Final    Comment:    Glucose reference range applies only to samples taken after fasting for at least 8 hours.    Patient Active Problem List   Diagnosis Date Noted   Symptomatic mammary hypertrophy 11/12/2022   Back pain 11/12/2022   Neck pain 11/12/2022   Class 3 severe obesity  due to excess calories with serious comorbidity and body mass index (BMI) of 40.0 to 44.9 in adult [facility]) 09/30/2022   Mild episode of recurrent major depressive disorder ([place]) 09/30/2022   GAD (generalized anxiety disorder) 09/30/2022   Attention deficit hyperactivity disorder (ADHD), combined type 09/30/2022   Allergic rhinitis due to pollen 12/30/2021   Asthma 12/30/2021   Flat foot 12/30/2021   Counseling for parent-child problem 12/30/2021   Myopia 12/30/2021   Pain in joint, lower leg [id number]   Plica syndrome [id number]    Past Surgical History:  Procedure Laterality Date   ARTHROSCOPIC REPAIR ACL Right     Family History  Problem Relation Age of Onset   Hypertension Mother    Hypertension Father     Social History   Socioeconomic History   Marital status: Single    Spouse name: Not on file   Number of children: Not on file   Years of education: Not on file   Highest education level: Master's degree (e.g., MA, MS, MEng, MEd, MSW, MBA)  Occupational History   Not on file  Tobacco Use   Smoking status: Never   Smokeless tobacco: Never  Vaping Use   Vaping Use: Never used  Substance and Sexual Activity   Alcohol use: Yes   Drug use: Never   Sexual activity: Yes    Birth control/protection: Pill, Condom  Other Topics Concern   Not on file  Social History Narrative   Work at [employer] in [employer] for [place] Strain: Medium Risk (01/13/2023)   Overall Financial Resource Strain (CARDIA)    Difficulty of Paying Living Expenses: Somewhat hard  Food Insecurity: No Food Insecurity (01/13/2023)   Hunger Vital Sign    Worried About Running Out of Food in the Last Year: Never true    Ran Out of Food in the Last Year: Never true  Transportation Needs: No Transportation Needs (01/13/2023)   PRAPARE - Hydrologist (Medical): No    Lack of Transportation (Non-Medical):  No  Physical Activity: Sufficiently Active (01/13/2023)   Exercise Vital Sign    Days of Exercise per Week: 4 days    Minutes of Exercise per Session: 60 min  Stress: Stress Concern Present (01/13/2023)   [place]    Feeling of Stress : Rather much  Social Connections: Moderately Integrated (01/13/2023)   Social Connection and Isolation Panel [NHANES]    Frequency of Communication with Friends and Family: More than three times a week    Frequency of Social Gatherings with Friends and Family: More than three times a week    Attends Religious Services: More than 4 times per  year    Active Member of Clubs or Organizations: Yes    Attends Archivist Meetings: More than 4 times per year    Marital Status: Never married  Intimate Partner Violence: Not At Risk (01/13/2023)   Humiliation, Afraid, Rape, and Kick questionnaire    Fear of Current or Ex-Partner: No    Emotionally Abused: No    Physically Abused: No    Sexually Abused: No     Current Outpatient Medications:    CRYSELLE-28 0.3-30 MG-MCG tablet, Take 1 tablet by mouth daily., Disp: , Rfl:    fluticasone (FLONASE) 50 MCG/ACT nasal spray, Place into both nostrils daily., Disp: , Rfl:    Liraglutide -Weight Management (SAXENDA) 18 MG/3ML SOPN, Inject 0.6 mg into the skin daily., Disp: 3 mL, Rfl: 0   montelukast (SINGULAIR) 10 MG tablet, Take 1 tablet (10 mg total) by mouth at bedtime., Disp: 90 tablet, Rfl: 3   traZODone (DESYREL) 50 MG tablet, Take 50 mg by mouth at bedtime., Disp: , Rfl:    venlafaxine XR (EFFEXOR-XR) 150 MG 24 hr capsule, , Disp: , Rfl:    VYVANSE 30 MG capsule, Take 30 mg by mouth daily., Disp: , Rfl:    Insulin Pen Needle 32G X 6 MM MISC, 1 Needle by Does not apply route daily. (Patient not taking: Reported on 01/13/2023), Disp: 30 each, Rfl: 0  Allergies  Allergen Reactions   Pineapple Anaphylaxis and Shortness Of Breath   Avocado  Nausea Only   Banana Nausea Only     ROS  Constitutional: Negative for fever or weight change.  Respiratory: Negative for cough and shortness of breath.   Cardiovascular: Negative for chest pain or palpitations.  Gastrointestinal: Negative for abdominal pain, no bowel changes.  Musculoskeletal: Negative for gait problem or joint swelling.  Skin: Negative for rash.  Neurological: Negative for dizziness or headache.  No other specific complaints in a complete review of systems (except as listed in HPI above).   Objective  Vitals:   01/13/23 0935  BP: 130/80  Resp: 16  Temp: 98.4 F (36.9 C)  TempSrc: Oral  SpO2: 98%  Weight: 238 lb 12.8 oz (108.3 kg)  Height: 5\' 3"  (1.6 m)    Body mass index is 42.3 kg/m.  Physical Exam Constitutional: Patient appears well-developed and well-nourished. No distress.  HENT: Head: Normocephalic and atraumatic. Ears: B TMs ok, no erythema or effusion; Nose: Nose normal. Mouth/Throat: Oropharynx is clear and moist. No oropharyngeal exudate.  Eyes: Conjunctivae and EOM are normal. Pupils are equal, round, and reactive to light. No scleral icterus.  Neck: Normal range of motion. Neck supple. No JVD present. No thyromegaly present.  Cardiovascular: Normal rate, regular rhythm and normal heart sounds.  No murmur heard. No BLE edema. Pulmonary/Chest: Effort normal and breath sounds normal. No respiratory distress. Abdominal: Soft. Bowel sounds are normal, no distension. There is no tenderness. no masses Musculoskeletal: Normal range of motion, no joint effusions. No gross deformities Neurological: he is alert and oriented to person, place, and time. No cranial nerve deficit. Coordination, balance, strength, speech and gait are normal.  Skin: Skin is warm and dry. No rash noted. No erythema.  Psychiatric: Patient has a normal mood and affect. behavior is normal. Judgment and thought content normal.   No results found for this or any previous visit  (from the past 2160 hour(s)).   Fall Risk:    01/13/2023    9:37 AM 09/30/2022   10:30 AM  Fall Risk  Falls in the past year? 0 0  Number falls in past yr: 0 0  Injury with Fall? 0 0  Follow up  Falls evaluation completed     Functional Status Survey: Is the patient deaf or have difficulty hearing?: No Does the patient have difficulty seeing, even when wearing glasses/contacts?: No Does the patient have difficulty concentrating, remembering, or making decisions?: No Does the patient have difficulty walking or climbing stairs?: No Does the patient have difficulty dressing or bathing?: No Does the patient have difficulty doing errands alone such as visiting a doctor's office or shopping?: No   Assessment & Plan  1. Annual physical exam Labs recently done  Increase physical activity Continue to eat well balanced diet  -USPSTF grade A and B recommendations reviewed with patient; age-appropriate recommendations, preventive care, screening tests, etc discussed and encouraged; healthy living encouraged; see AVS for patient education given to patient -Discussed importance of 150 minutes of physical activity weekly, eat two servings of fish weekly, eat one serving of tree nuts ( cashews, pistachios, pecans, almonds.Marland Kitchen) every other day, eat 6 servings of fruit/vegetables daily and drink plenty of water and avoid sweet beverages.

## 2023-01-12 ENCOUNTER — Ambulatory Visit: Payer: BC Managed Care – PPO | Admitting: Physical Therapy

## 2023-01-12 ENCOUNTER — Encounter: Payer: Self-pay | Admitting: Physical Therapy

## 2023-01-12 DIAGNOSIS — M546 Pain in thoracic spine: Secondary | ICD-10-CM

## 2023-01-12 DIAGNOSIS — M542 Cervicalgia: Secondary | ICD-10-CM | POA: Diagnosis not present

## 2023-01-12 DIAGNOSIS — F4312 Post-traumatic stress disorder, chronic: Secondary | ICD-10-CM | POA: Diagnosis not present

## 2023-01-12 NOTE — Therapy (Signed)
OUTPATIENT PHYSICAL THERAPY TREATMENT   Patient Name: Tracey Cervantes MRN: [id number] DOB:03-26-1991, 32 y.o., female Today's Date: 01/12/2023  END OF SESSION:  PT End of Session - 01/12/23 0749     Visit Number 5    Number of Visits 17    Date for PT Re-Evaluation 02/25/23    Authorization Type [employer]BS COMM Pro    Authorization Time Period 12/22/22-02/25/23    Authorization - Visit Number 5    Authorization - Number of Visits 10    Progress Note Due on Visit 10    PT Start Time 0749    PT Stop Time 0827    PT Time Calculation (min) 38 min    Activity Tolerance Patient tolerated treatment well;No increased pain    Behavior During Therapy [facility] for tasks assessed/performed                Past Medical History:  Diagnosis Date   ADHD    Allergy    Anxiety    Back pain    Depression    Past Surgical History:  Procedure Laterality Date   ARTHROSCOPIC REPAIR ACL Right    Patient Active Problem List   Diagnosis Date Noted   Symptomatic mammary hypertrophy 11/12/2022   Back pain 11/12/2022   Neck pain 11/12/2022   Class 3 severe obesity due to excess calories with serious comorbidity and body mass index (BMI) of 40.0 to 44.9 in adult [facility]) 09/30/2022   Mild episode of recurrent major depressive disorder ([place]) 09/30/2022   GAD (generalized anxiety disorder) 09/30/2022   Attention deficit hyperactivity disorder (ADHD), combined type 09/30/2022   Allergic rhinitis due to pollen 12/30/2021   Asthma 12/30/2021   Flat foot 12/30/2021   Counseling for parent-child problem 12/30/2021   Myopia 12/30/2021   Pain in joint, lower leg [id number]   Plica syndrome [id number]    PCP: Bo Merino, FNP  REFERRING PROVIDER: Wallace Going, DO  REFERRING DIAG: Chronic bilateral thoracic back pain; neck pain  Rationale for Evaluation and Treatment: Rehabilitation  THERAPY DIAG:  Pain in thoracic spine Cervicalgia  ONSET DATE: Around 2019  SUBJECTIVE:                                                                                                                                                                                            SUBJECTIVE STATEMENT: Pt reports she is having some increased pain 6/10 today, was worsened Saturday night with insideous onset. Still getting benefit from [facility] and reports compliance with HEP.   PERTINENT HISTORY:  Pt is a 32 y.o. female who presents with chronic thoracic back pain  with secondary bilat cervicalgia that started around 2019. Since onset the pt reports her pain has gradually gotten worse. Pt seeking breast reduction with Dr. Marla Roe, Dr. Jacquelynn Cree notes that this would be helpful to bra strap pain. Pt states her pain originates in the suboccipital region and stops in the upper thoracic region. No distal sx. No N/T. Imaging is not available at this time. Pt describes pain as an achy soreness. Worst pain is 6/10 NPRS. Best pain is 2/10 NPRS. Currently pain is 5/10 NPRS. Pt reports finding relief with anti-inflammatories, heat packs, and movement. Pt has found that some of the  Pt states the pain gradually increases throughout the course of the day. Pt reports her thoracic back pain is more "tense and tight" in the morning and varies in intensity depending on what she did the day before. Once she gets moving it feels better and it ebbs and flows during the work day. At night, after her workout class her pain comes back on. Pt able to sleep through the night with no issues. Pt works full-time at [employer] in [employer], and works at her computer for most of the day. Pt is an avid exerciser and works out at [employer] 3-4x/week. PMH: DDD, and underwent two surgeries in L knee. Pt denies N/V, B&B changes, unexplained weight fluctuation, saddle paresthesia, fever, night sweats, or unrelenting night pain at this time.   PAIN:  Are you having pain? Yes 5/10   PRECAUTIONS: None  WEIGHT BEARING RESTRICTIONS:  No  FALLS:  Has patient fallen in last 6 months? No    PATIENT GOALS: Manage and reduce pain   NEXT MD VISIT:   OBJECTIVE:    TODAY'S TREATMENT:                                                                                                                              DATE: 01/12/23  -Nustep seat 8 UE 11 L1  x4 minutes, towel roll between shoulder blades   -Thoracic ext with hands behind head over foam roller x12 -Open book x12 bilat with cuing for technique with good carry over - Pec stretch hooklying with towel at spine  Post shoulder rolls x15 Baby eagle stretch 30secH  Manual STM with trigger point release  to bilat UT and levator scap Manual traction 10sec traction/10sec release x10  C0-1 G1-2 CPA 30sec bouts x4 for pain reduction  Following: Dry Needling: (2/2) 45mm .25 needles placed along bilat UT to decrease increased muscular spasms and trigger points with the patient positioned in supine. Patient was educated on risks and benefits of therapy and verbally consents to PT.   Manual cervical traction 10sec on/of x12      PATIENT EDUCATION:  Education details: Pt was educated on diagnosis, anatomy and pathology involved, prognosis, role of PT, and was given an HEP, demonstrating exercise with proper form and technique following demonstration, VC, and TC, and was given a paper  hand out to continue exercise at home. Pt was educated on and agreed to [place]. Person educated: Patient Education method: Explanation, Demonstration, Tactile cues, Verbal cues, and Handouts Education comprehension: verbalized understanding, returned demonstration, verbal cues required, and tactile cues required  HOME EXERCISE PROGRAM: Pt reviewed the following HEP with pt and was able to demonstrate a set of the following with min cuing for correction needed. PT educated pt on parameters of therex (how/when to inc/decrease intensity, frequency, rep/set range, stretch hold time, and purpose of  therex) with verbalized understanding.    - UT stretch 3 x 30 sec holds (2-3x/day)  - LS stretch 3 x 30 sec holds (2-3x/day) - Suboccipital release stretch 3 x 30 sec holds (2-3x/day)  ASSESSMENT:  CLINICAL IMPRESSION: Continued with postural ROM work and strengthening interventions. Pt continues to report good response to TPDN with paplable decrease in tension/trigger points. Patient is able to comply with all cuing for proper technique of therex with good carry over of all cuing. Good motivation throughout session with no increased pain. Would benefit from skilled PT to address above deficits and promote optimal return to PLOF.    OBJECTIVE IMPAIRMENTS: decreased endurance, decreased strength, and pain.  ACTIVITY LIMITATIONS:  Desk job duties  PARTICIPATION LIMITATIONS:  Completing workout regimen, occupation  PERSONAL FACTORS: None   REHAB POTENTIAL: Excellent  CLINICAL DECISION MAKING: Evolving/moderate complexity  EVALUATION COMPLEXITY: Moderate   GOALS: Goals reviewed with patient? Yes  SHORT TERM GOALS: Target date: 01/19/23  Pt will execute HEP independently with proper form and technique in order to return to PLOF at home Baseline: HEP given on 12/22/22 Goal status: INITIAL   LONG TERM GOALS: Target date: 02/16/23  Pt will increase FOTO score to 71 to demonstrate predicted increase in functional mobility to complete ADLs Baseline: 57 Goal status: INITIAL  2.  Pt will decrease worst pain as reported on NPRS by at least 3 points in order to demonstrate clinically significant reduction in pain. Baseline: NPRS - 6/10 Goal status: INITIAL  3.  Pt will demonstrate periscapular strength of 4/5 in order to demonstrate strength needed for ADLs Baseline: 3+/5 Goal status: INITIAL  4.  Pt will complete DNF test of 29 secs to demonstrate a clinically significant increase in strength needed to complete ADLs Baseline: 12/22/22: 18 secs Goal status: INITIAL  5. Pt will  demonstrate shoulder strength of 4/5 in order to demonstrate strength needed for ADLs Baseline: 12/22/22: 3+/5 abd Goal status: INITIAL    PLAN:  PT FREQUENCY: 2x/week  PT DURATION: 8 weeks  PLANNED INTERVENTIONS: Therapeutic exercises, Therapeutic activity, Neuromuscular re-education, Self Care, Joint mobilization, Dry Needling, and Manual therapy  PLAN FOR NEXT SESSION: Continue with ROM and strength interventions   Durwin Reges DPT Durwin Reges, PT 01/12/2023, 12:17 PM

## 2023-01-13 ENCOUNTER — Ambulatory Visit: Payer: BC Managed Care – PPO | Admitting: Nurse Practitioner

## 2023-01-13 ENCOUNTER — Other Ambulatory Visit: Payer: Self-pay

## 2023-01-13 VITALS — BP 130/80 | Temp 98.4°F | Resp 16 | Ht 63.0 in | Wt 238.8 lb

## 2023-01-13 DIAGNOSIS — F900 Attention-deficit hyperactivity disorder, predominantly inattentive type: Secondary | ICD-10-CM | POA: Diagnosis not present

## 2023-01-13 DIAGNOSIS — Z0000 Encounter for general adult medical examination without abnormal findings: Secondary | ICD-10-CM

## 2023-01-13 DIAGNOSIS — F4310 Post-traumatic stress disorder, unspecified: Secondary | ICD-10-CM | POA: Diagnosis not present

## 2023-01-13 DIAGNOSIS — G4700 Insomnia, unspecified: Secondary | ICD-10-CM | POA: Diagnosis not present

## 2023-01-13 DIAGNOSIS — F331 Major depressive disorder, recurrent, moderate: Secondary | ICD-10-CM | POA: Diagnosis not present

## 2023-01-17 ENCOUNTER — Encounter: Payer: BC Managed Care – PPO | Admitting: Physical Therapy

## 2023-01-18 ENCOUNTER — Ambulatory Visit: Payer: BC Managed Care – PPO | Admitting: Physical Therapy

## 2023-01-19 ENCOUNTER — Encounter: Payer: BC Managed Care – PPO | Admitting: Physical Therapy

## 2023-01-20 ENCOUNTER — Ambulatory Visit: Payer: BC Managed Care – PPO | Admitting: Physical Therapy

## 2023-01-25 ENCOUNTER — Ambulatory Visit: Payer: BC Managed Care – PPO | Attending: Plastic Surgery | Admitting: Physical Therapy

## 2023-01-25 ENCOUNTER — Encounter: Payer: Self-pay | Admitting: Physical Therapy

## 2023-01-25 DIAGNOSIS — M546 Pain in thoracic spine: Secondary | ICD-10-CM | POA: Diagnosis not present

## 2023-01-25 DIAGNOSIS — M542 Cervicalgia: Secondary | ICD-10-CM | POA: Diagnosis not present

## 2023-01-25 DIAGNOSIS — F4312 Post-traumatic stress disorder, chronic: Secondary | ICD-10-CM | POA: Diagnosis not present

## 2023-01-25 NOTE — Therapy (Signed)
OUTPATIENT PHYSICAL THERAPY TREATMENT/DC Summary Reporting Period 12/22/22 - 01/25/23   Patient Name: Tracey Cervantes MRN: [id number] DOB:1991/06/10, 32 y.o., female Today's Date: 01/25/2023  END OF SESSION:  PT End of Session - 01/25/23 0752     Visit Number 6    Number of Visits 17    Date for PT Re-Evaluation 02/25/23    Authorization Type [employer] COMM Pro    Authorization Time Period 12/22/22-02/25/23    Authorization - Visit Number 6    Authorization - Number of Visits 10    Progress Note Due on Visit 10    PT Start Time 0748    PT Stop Time 0818    PT Time Calculation (min) 30 min    Activity Tolerance Patient tolerated treatment well;No increased pain    Behavior During Therapy [facility] for tasks assessed/performed                 Past Medical History:  Diagnosis Date   ADHD    Allergy    Anxiety    Back pain    Depression    Past Surgical History:  Procedure Laterality Date   ARTHROSCOPIC REPAIR ACL Right    Patient Active Problem List   Diagnosis Date Noted   Symptomatic mammary hypertrophy 11/12/2022   Back pain 11/12/2022   Neck pain 11/12/2022   Class 3 severe obesity due to excess calories with serious comorbidity and body mass index (BMI) of 40.0 to 44.9 in adult 09/30/2022   Mild episode of recurrent major depressive disorder 09/30/2022   GAD (generalized anxiety disorder) 09/30/2022   Attention deficit hyperactivity disorder (ADHD), combined type 09/30/2022   Allergic rhinitis due to pollen 12/30/2021   Asthma 12/30/2021   Flat foot 12/30/2021   Counseling for parent-child problem 12/30/2021   Myopia 12/30/2021   Pain in joint, lower leg [id number]   Plica syndrome [id number]    PCP: Bo Merino, FNP  REFERRING PROVIDER: Wallace Going, DO  REFERRING DIAG: Chronic bilateral thoracic back pain; neck pain  Rationale for Evaluation and Treatment: Rehabilitation  THERAPY DIAG:  Pain in thoracic spine Cervicalgia  ONSET DATE: Around  2019  SUBJECTIVE:                                                                                                                                                                                           SUBJECTIVE STATEMENT: Pt reports she is having some increased neck pain today following driving to [facility] for the weekend. Gives this pain 6/10. Is having a virtual call with PA this week to discuss surgical interventions.   PERTINENT HISTORY:  Pt  is a 32 y.o. female who presents with chronic thoracic back pain with secondary bilat cervicalgia that started around 2019. Since onset the pt reports her pain has gradually gotten worse. Pt seeking breast reduction with Dr. Marla Roe, Dr. Jacquelynn Cree notes that this would be helpful to bra strap pain. Pt states her pain originates in the suboccipital region and stops in the upper thoracic region. No distal sx. No N/T. Imaging is not available at this time. Pt describes pain as an achy soreness. Worst pain is 6/10 NPRS. Best pain is 2/10 NPRS. Currently pain is 5/10 NPRS. Pt reports finding relief with anti-inflammatories, heat packs, and movement. Pt has found that some of the  Pt states the pain gradually increases throughout the course of the day. Pt reports her thoracic back pain is more "tense and tight" in the morning and varies in intensity depending on what she did the day before. Once she gets moving it feels better and it ebbs and flows during the work day. At night, after her workout class her pain comes back on. Pt able to sleep through the night with no issues. Pt works full-time at [employer] in [employer], and works at her computer for most of the day. Pt is an avid exerciser and works out at [employer] 3-4x/week. PMH: DDD, and underwent two surgeries in L knee. Pt denies N/V, B&B changes, unexplained weight fluctuation, saddle paresthesia, fever, night sweats, or unrelenting night pain at this time.   PAIN:  Are you having pain? Yes 5/10    PRECAUTIONS: None  WEIGHT BEARING RESTRICTIONS: No  FALLS:  Has patient fallen in last 6 months? No    PATIENT GOALS: Manage and reduce pain   NEXT MD VISIT:   OBJECTIVE:    TODAY'S TREATMENT:                                                                                                                              DATE: 01/12/23  -Nustep seat 8 UE 11 L1 x4 minutes, towel roll between shoulder blades  FOTO completed  MMT assessment   PT reviewed the following HEP with patient with patient able to demonstrate a set of the following with min cuing for correction needed. PT educated patient on parameters of therex (how/when to inc/decrease intensity, frequency, rep/set range, stretch hold time, and purpose of therex) with verbalized understanding.  Access Code: [email or web address]  - Shoulder External Rotation and Scapular Retraction with Resistance  - 1-2 x weekly - 2-3 sets - 8-12 reps - Standing Shoulder Horizontal Abduction with Resistance  - 1-2 x weekly - 2-3 sets - 8-12 reps - Standing Shoulder Scaption  - 1-2 x weekly - 2-3 sets - 8-12 reps - Standing High Row with Resistance  - 1-2 x weekly - 2-3 sets - 8-12 reps - Doorway Pec Stretch at 120 Degrees Abduction  - 1-2 x daily - 7 x weekly - 30-60sec hold - UT stretch  3 x 30 sec holds (2-3x/day) - LS stretch 3 x 30 sec holds (2-3x/day) - Suboccipital release stretch 3 x 30 sec holds (2-3x/day)     PATIENT EDUCATION:  Education details: Pt was educated on diagnosis, anatomy and pathology involved, prognosis, role of PT, and was given an HEP, demonstrating exercise with proper form and technique following demonstration, VC, and TC, and was given a paper hand out to continue exercise at home. Pt was educated on and agreed to [place]. Person educated: Patient Education method: Explanation, Demonstration, Tactile cues, Verbal cues, and Handouts Education comprehension: verbalized understanding, returned demonstration, verbal cues  required, and tactile cues required  HOME EXERCISE PROGRAM: Pt reviewed the following HEP with pt and was able to demonstrate a set of the following with min cuing for correction needed. PT educated pt on parameters of therex (how/when to inc/decrease intensity, frequency, rep/set range, stretch hold time, and purpose of therex) with verbalized understanding.    - UT stretch 3 x 30 sec holds (2-3x/day)  - LS stretch 3 x 30 sec holds (2-3x/day) - Suboccipital release stretch 3 x 30 sec holds (2-3x/day)  ASSESSMENT:  CLINICAL IMPRESSION: PT reassessed patient this visit where she has made significant improvements in periscapular and RTC strength, and DNF endurance. Patient has come a long way in restoration of effective upper body length/tension relationships. She continues to have nagging upper back pain, with reasonable indication for breast reduction. Patient is a good surgical candidate due to pre-op strength and postural education. Pt is able to comply with all cuing and understanding of all education given for robust HEP. Patient given clinic contact info if any questions or concerns arise.    OBJECTIVE IMPAIRMENTS: decreased endurance, decreased strength, and pain.  ACTIVITY LIMITATIONS:  Desk job duties  PARTICIPATION LIMITATIONS:  Completing workout regimen, occupation  PERSONAL FACTORS: None   REHAB POTENTIAL: Excellent  CLINICAL DECISION MAKING: Evolving/moderate complexity  EVALUATION COMPLEXITY: Moderate   GOALS: Goals reviewed with patient? Yes  SHORT TERM GOALS: Target date: 01/19/23  Pt will execute HEP independently with proper form and technique in order to return to PLOF at home Baseline: HEP given on 12/22/22 Goal status: INITIAL   LONG TERM GOALS: Target date: 02/16/23  Pt will increase FOTO score to 71 to demonstrate predicted increase in functional mobility to complete ADLs Baseline: 57; 01/25/23 79 Goal status: MET  2.  Pt will decrease worst pain as  reported on NPRS by at least 3 points in order to demonstrate clinically significant reduction in pain. Baseline: NPRS - 6/10; 01/25/23 NPRS neck pain 6/10 Goal status: NOT MET  3.  Pt will demonstrate periscapular strength of 4/5 in order to demonstrate strength needed for ADLs Baseline: 3+/5; 01/25/23 R/L Y LT: 4+ bilat; T PS: 5 bilat Goal status: MET  4.  Pt will complete DNF test of 29 secs to demonstrate a clinically significant increase in strength needed to complete ADLs Baseline: 12/22/22: 18 secs; 01/25/23 38sec Goal status: MET  5. Pt will demonstrate shoulder strength of 4/5 in order to demonstrate strength needed for ADLs Baseline: 12/22/22: 3+/5 abd; 01/25/23 5/5 gross RTC Goal status: met    PLAN:  PT FREQUENCY: 2x/week  PT DURATION: 8 weeks  PLANNED INTERVENTIONS: Therapeutic exercises, Therapeutic activity, Neuromuscular re-education, Self Care, Joint mobilization, Dry Needling, and Manual therapy  PLAN FOR NEXT SESSION: Continue with ROM and strength interventions   Durwin Reges DPT Durwin Reges, PT 01/25/2023, 8:26 AM

## 2023-01-27 ENCOUNTER — Ambulatory Visit: Payer: BC Managed Care – PPO | Admitting: Physical Therapy

## 2023-01-28 ENCOUNTER — Telehealth: Payer: BC Managed Care – PPO | Admitting: Surgical

## 2023-02-01 ENCOUNTER — Ambulatory Visit: Payer: BC Managed Care – PPO | Admitting: Physical Therapy

## 2023-02-01 DIAGNOSIS — F4312 Post-traumatic stress disorder, chronic: Secondary | ICD-10-CM | POA: Diagnosis not present

## 2023-02-03 ENCOUNTER — Ambulatory Visit: Payer: BC Managed Care – PPO | Admitting: Physical Therapy

## 2023-02-08 DIAGNOSIS — F4312 Post-traumatic stress disorder, chronic: Secondary | ICD-10-CM | POA: Diagnosis not present

## 2023-02-14 ENCOUNTER — Ambulatory Visit (INDEPENDENT_AMBULATORY_CARE_PROVIDER_SITE_OTHER): Payer: BC Managed Care – PPO | Admitting: Surgical

## 2023-02-14 DIAGNOSIS — N62 Hypertrophy of breast: Secondary | ICD-10-CM

## 2023-02-14 DIAGNOSIS — G8929 Other chronic pain: Secondary | ICD-10-CM

## 2023-02-14 DIAGNOSIS — M546 Pain in thoracic spine: Secondary | ICD-10-CM

## 2023-02-14 NOTE — Progress Notes (Signed)
   Referring Provider Pender, Julie F, FNP [address] [place],  [place] [address]   CC:  Chief Complaint  Patient presents with   Follow-up      Tracey Cervantes is an 32 y.o. female.  HPI: Patient is a 32 y.o. 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 She reports that she has completed her 6 weeks of physical therapy, she endorses ongoing neck, shoulder and back pain.  She reports the pain extends from where her bra straps are into her mid back.  She reports that physical therapy helped a little bit during the appointments, but she continued to have pain at other times.   The patient gave consent to have this visit done by telemedicine / virtual visit, two identifiers were used to identify patient. This is also consent for access the chart and treat the patient via this visit. The patient is located in [place].  I, the provider, am at the office.  We spent 4 minutes together for the visit.  Joined by telephone.   Review of Systems General: No fevers or chills  Physical Exam    01/13/2023    9:35 AM 01/07/2023    9:57 AM 11/12/2022    8:52 AM  Vitals with BMI  Height     Weight 238 lbs 13 oz  241 lbs  BMI 42.31  42.7  Systolic 130 151 295  Diastolic 80 79 97  Pulse  87 86    General:  No acute distress, Normal speech Psych: Normal behavior and mood   Assessment/Plan Patient has completed her 6 weeks of physical therapy related to symptomatic mammary hypertrophy/macromastia.  She continues to endorse upper back, neck and shoulder pain.  She is interested in pursuing surgical intervention.  We discussed we would submit this to insurance, insurance authorization can take up to 6 weeks.  We discussed that if she doesBerniece Salinesy feedback from our office in the next 6 weeks to please call our office to check in.  Patient was agreeable    Tracey Cervantes 02/14/2023, 1:50 PM

## 2023-02-17 DIAGNOSIS — F4310 Post-traumatic stress disorder, unspecified: Secondary | ICD-10-CM | POA: Diagnosis not present

## 2023-02-17 DIAGNOSIS — F4312 Post-traumatic stress disorder, chronic: Secondary | ICD-10-CM | POA: Diagnosis not present

## 2023-02-17 DIAGNOSIS — F411 Generalized anxiety disorder: Secondary | ICD-10-CM | POA: Diagnosis not present

## 2023-02-17 DIAGNOSIS — F900 Attention-deficit hyperactivity disorder, predominantly inattentive type: Secondary | ICD-10-CM | POA: Diagnosis not present

## 2023-02-17 DIAGNOSIS — F331 Major depressive disorder, recurrent, moderate: Secondary | ICD-10-CM | POA: Diagnosis not present

## 2023-02-23 DIAGNOSIS — F4312 Post-traumatic stress disorder, chronic: Secondary | ICD-10-CM | POA: Diagnosis not present

## 2023-03-01 DIAGNOSIS — F4312 Post-traumatic stress disorder, chronic: Secondary | ICD-10-CM | POA: Diagnosis not present

## 2023-03-03 ENCOUNTER — Ambulatory Visit (INDEPENDENT_AMBULATORY_CARE_PROVIDER_SITE_OTHER): Payer: BC Managed Care – PPO | Admitting: Family Medicine

## 2023-03-03 ENCOUNTER — Encounter (INDEPENDENT_AMBULATORY_CARE_PROVIDER_SITE_OTHER): Payer: Self-pay | Admitting: Family Medicine

## 2023-03-03 VITALS — BP 130/84 | HR 94 | Temp 97.7°F | Ht 63.5 in | Wt 234.0 lb

## 2023-03-03 DIAGNOSIS — Z6841 Body Mass Index (BMI) 40.0 and over, adult: Secondary | ICD-10-CM

## 2023-03-03 DIAGNOSIS — R7303 Prediabetes: Secondary | ICD-10-CM | POA: Diagnosis not present

## 2023-03-03 DIAGNOSIS — F902 Attention-deficit hyperactivity disorder, combined type: Secondary | ICD-10-CM

## 2023-03-03 DIAGNOSIS — F411 Generalized anxiety disorder: Secondary | ICD-10-CM | POA: Diagnosis not present

## 2023-03-03 NOTE — Assessment & Plan Note (Signed)
Lab Results  Component Value Date   HGBA1C 6.2 (H) 09/30/2022   She has never been diagnosed with T2DM. She is at risk for T2DM with a BMI of 40 She is currently over - consuming refined carbohydrates and added sugar She is consistent with vigorous workouts  Plan to recheck A1c and fasting insulin next visit Consider use of metformin

## 2023-03-03 NOTE — Assessment & Plan Note (Signed)
She reports a stable mood on Effexor Xr 150 mg daily She reports gaining weight due to mood changes in 2018 with use of mood medications She has a long hx of emotional eating She is under a lot of stress  She will be a good candidate for CBT with Dr Dewaine Conger Continue current meds per psych and begin working on stress reduction

## 2023-03-03 NOTE — Progress Notes (Signed)
Office: [phone]  /  Fax: [phone]   Initial Visit  Tracey Cervantes was seen in clinic today to evaluate for obesity. She is interested in losing weight to improve overall health and reduce the risk of weight related complications. She presents today to review program treatment options, initial physical assessment, and evaluation.     She was referred by: PCP  When asked what else they would like to accomplish? She states: Improve quality of life  Weight history:  gained 80 lb in 5 year; was started on anxiety meds.  She would like to be 160 lb  When asked how has your weight affected you? She states: Problems with depression and or anxiety  Some associated conditions: Prediabetes  Contributing factors: Disruption of circadian rhythm, Stress, and Mental health problems  Weight promoting medications identified: Psychotropic medications  Current nutrition plan: None  Current level of physical activity: None and Other: [employer] several days of the week sedentary job  Current or previous pharmacotherapy: GLP-1 she was prescribed Saxenda in December but   Response to medication: Never tried medications   Past medical history includes:   Past Medical History:  Diagnosis Date   ADHD    Allergy    Anxiety    Back pain    Depression      Objective:   BP 130/84   Pulse 94   Temp 97.7 F (36.5 C)   Ht 5' 3.5" (1.613 m)   Wt 234 lb (106.1 kg)   SpO2 99%   BMI 40.80 kg/m  She was weighed on the bioimpedance scale: Body mass index is 40.8 kg/m.  Peak Weight:234 , Body Fat%:45.1, Visceral Fat Rating:11, Weight trend over the last 12 months: Increasing  General:  Alert, oriented and cooperative. Patient is in no acute distress.  Respiratory: Normal respiratory effort, no problems with respiration noted   Gait: able to ambulate independently  Mental Status: Normal mood and affect. Normal behavior. Normal judgment and thought content.   DIAGNOSTIC DATA  REVIEWED:  BMET    Component Value Date/Time   NA 142 09/30/2022 1117   K 4.4 09/30/2022 1117   CL 106 09/30/2022 1117   CO2 28 09/30/2022 1117   GLUCOSE 112 (H) 09/30/2022 1117   BUN 10 09/30/2022 1117   CREATININE 0.89 09/30/2022 1117   CALCIUM 9.3 09/30/2022 1117   GFRNONAA >60 11/29/2021 2228   Lab Results  Component Value Date   HGBA1C 6.2 (H) 09/30/2022   No results found for: "INSULIN" CBC    Component Value Date/Time   WBC 6.9 09/30/2022 1117   RBC 4.49 09/30/2022 1117   HGB 13.1 09/30/2022 1117   HCT 38.9 09/30/2022 1117   PLT 298 09/30/2022 1117   MCV 86.6 09/30/2022 1117   MCH 29.2 09/30/2022 1117   MCHC 33.7 09/30/2022 1117   RDW 12.7 09/30/2022 1117   Iron/TIBC/Ferritin/ %Sat No results found for: "IRON", "TIBC", "FERRITIN", "IRONPCTSAT" Lipid Panel     Component Value Date/Time   CHOL 179 09/30/2022 1117   TRIG 70 09/30/2022 1117   HDL 61 09/30/2022 1117   CHOLHDL 2.9 09/30/2022 1117   LDLCALC 102 (H) 09/30/2022 1117   Hepatic Function Panel     Component Value Date/Time   PROT 7.2 09/30/2022 1117   AST 14 09/30/2022 1117   ALT 20 09/30/2022 1117   BILITOT 0.3 09/30/2022 1117      Component Value Date/Time   TSH 1.79 09/30/2022 1117     Assessment and  Plan:   Prediabetes Assessment & Plan: Lab Results  Component Value Date   HGBA1C 6.2 (H) 09/30/2022   She has never been diagnosed with T2DM. She is at risk for T2DM with a BMI of 40 She is currently over - consuming refined carbohydrates and added sugar She is consistent with vigorous workouts  Plan to recheck A1c and fasting insulin next visit Consider use of metformin   Morbid obesity (HCC) with starting BMI 40  Attention deficit hyperactivity disorder (ADHD), combined type Assessment & Plan: She is currently on Vyvanse 30 mg daily for ADHD She is skipping meals on the weekends due to lack of hunger and getting distracted  She would not be a candidate for Phentermine or  Qsymia due to her use of Vyvanse Try to set reminders on phone to eat regular meals everyday   GAD (generalized anxiety disorder) Assessment & Plan: She reports a stable mood on Effexor Xr 150 mg daily She reports gaining weight due to mood changes in 2018 with use of mood medications She has a long hx of emotional eating She is under a lot of stress  She will be a good candidate for CBT with Dr Dewaine Conger Continue current meds per psych and begin working on stress reduction         Obesity Treatment / Action Plan:  Patient will work on garnering support from family and friends to begin weight loss journey. Will work on eliminating or reducing the presence of highly palatable, calorie dense foods in the home. Will complete provided nutritional and psychosocial assessment questionnaire before the next appointment. Will be scheduled for indirect calorimetry to determine resting energy expenditure in a fasting state.  This will allow [place] to create a reduced calorie, high-protein meal plan to promote loss of fat mass while preserving muscle mass. Will work on reducing intake of added sugars, simple sugars and processed carbs. Will avoid skipping meals which may result in increased hunger signals and overeating at certain times. Was counseled on nutritional approaches to weight loss and benefits of complex carbs and high quality protein as part of nutritional weight management. Was counseled on pharmacotherapy and role as an adjunct in weight management.   Obesity Education Performed Today:  She was weighed on the bioimpedance scale and results were discussed and documented in the synopsis.  We discussed obesity as a disease and the importance of a more detailed evaluation of all the factors contributing to the disease.  We discussed the importance of long term lifestyle changes which include nutrition, exercise and behavioral modifications as well as the importance of customizing this to  her specific health and social needs.  We discussed the benefits of reaching a healthier weight to alleviate the symptoms of existing conditions and reduce the risks of the biomechanical, metabolic and psychological effects of obesity.  Tracey Cervantes appears to be in the action stage of change and states they are ready to start intensive lifestyle modifications and behavioral modifications.  30 minutes was spent today on this visit including the above counseling, pre-visit chart review, and post-visit documentation.  Reviewed by clinician on day of visit: allergies, medications, problem list, medical history, surgical history, family history, social history, and previous encounter notes pertinent to obesity diagnosis.    Seymour Bars, D.O. DABFM, DABOM [facility] [phone] [place], [place] [address] [phone]

## 2023-03-03 NOTE — Assessment & Plan Note (Signed)
She is currently on Vyvanse 30 mg daily for ADHD She is skipping meals on the weekends due to lack of hunger and getting distracted  She would not be a candidate for Phentermine or Qsymia due to her use of Vyvanse Try to set reminders on phone to eat regular meals everyday

## 2023-03-15 DIAGNOSIS — F4312 Post-traumatic stress disorder, chronic: Secondary | ICD-10-CM | POA: Diagnosis not present

## 2023-03-15 DIAGNOSIS — F331 Major depressive disorder, recurrent, moderate: Secondary | ICD-10-CM | POA: Diagnosis not present

## 2023-03-15 DIAGNOSIS — F900 Attention-deficit hyperactivity disorder, predominantly inattentive type: Secondary | ICD-10-CM | POA: Diagnosis not present

## 2023-03-15 DIAGNOSIS — G4700 Insomnia, unspecified: Secondary | ICD-10-CM | POA: Diagnosis not present

## 2023-03-15 DIAGNOSIS — F4310 Post-traumatic stress disorder, unspecified: Secondary | ICD-10-CM | POA: Diagnosis not present

## 2023-04-04 ENCOUNTER — Other Ambulatory Visit: Payer: Self-pay

## 2023-04-04 ENCOUNTER — Encounter: Payer: Self-pay | Admitting: Nurse Practitioner

## 2023-04-04 ENCOUNTER — Other Ambulatory Visit: Payer: Self-pay | Admitting: Nurse Practitioner

## 2023-04-04 ENCOUNTER — Other Ambulatory Visit (HOSPITAL_COMMUNITY): Payer: Self-pay

## 2023-04-04 MED ORDER — CRYSELLE-28 0.3-30 MG-MCG PO TABS
1.0000 | ORAL_TABLET | Freq: Every day | ORAL | 11 refills | Status: DC
  Filled 2023-04-04: qty 28, 28d supply, fill #0

## 2023-04-05 ENCOUNTER — Other Ambulatory Visit: Payer: Self-pay

## 2023-04-05 MED ORDER — CRYSELLE-28 0.3-30 MG-MCG PO TABS: 1.0000 | ORAL_TABLET | Freq: Every day | ORAL | 11 refills | Status: DC

## 2023-04-19 DIAGNOSIS — F411 Generalized anxiety disorder: Secondary | ICD-10-CM | POA: Diagnosis not present

## 2023-04-19 DIAGNOSIS — F4310 Post-traumatic stress disorder, unspecified: Secondary | ICD-10-CM | POA: Diagnosis not present

## 2023-04-19 DIAGNOSIS — F900 Attention-deficit hyperactivity disorder, predominantly inattentive type: Secondary | ICD-10-CM | POA: Diagnosis not present

## 2023-04-19 DIAGNOSIS — F331 Major depressive disorder, recurrent, moderate: Secondary | ICD-10-CM | POA: Diagnosis not present

## 2023-04-21 ENCOUNTER — Encounter: Payer: Self-pay | Admitting: *Deleted

## 2023-04-21 ENCOUNTER — Telehealth: Payer: Self-pay | Admitting: *Deleted

## 2023-04-21 NOTE — Telephone Encounter (Signed)
Message retrieved from Clinical voicemail. Pt calling to check on status of insurance submission for breast reduction. She saw Dr. Ulice Bold in January and Marquez in March after completing PT. Callback # [phone]

## 2023-04-24 ENCOUNTER — Telehealth: Payer: Self-pay | Admitting: *Deleted

## 2023-04-24 NOTE — Telephone Encounter (Signed)
AUTH - [id number] PENDING VIA BLUE-E FOR CPT [phone]

## 2023-04-25 ENCOUNTER — Telehealth: Payer: Self-pay

## 2023-04-25 NOTE — Telephone Encounter (Signed)
PA initiated for Saxenda

## 2023-05-19 DIAGNOSIS — F411 Generalized anxiety disorder: Secondary | ICD-10-CM | POA: Diagnosis not present

## 2023-05-19 DIAGNOSIS — F331 Major depressive disorder, recurrent, moderate: Secondary | ICD-10-CM | POA: Diagnosis not present

## 2023-05-19 DIAGNOSIS — F4310 Post-traumatic stress disorder, unspecified: Secondary | ICD-10-CM | POA: Diagnosis not present

## 2023-05-19 DIAGNOSIS — F900 Attention-deficit hyperactivity disorder, predominantly inattentive type: Secondary | ICD-10-CM | POA: Diagnosis not present

## 2023-05-19 NOTE — Progress Notes (Deleted)
Patient ID: Tracey Cervantes, female    DOB: Jan 05, 1991, 31 y.o.   MRN: [id number]  No chief complaint on file.   No diagnosis found.   History of Present Illness: Tracey Cervantes is a 32 y.o.  female  with a history of macromastia.  She presents for preoperative evaluation for upcoming procedure, bilateral breast reduction possible liposuction, scheduled for 06/02/2023 with Dr. Ulice Bold.  The patient {HAS HAS [id number]} had problems with anesthesia. ***  Summary of Previous Visit: She was seen for initial consult by Dr. Ulice Bold 11/12/2022.  At that time, complained of chronic upper back and neck discomfort in the context of large breasts.  STN 35 cm on the right, 38 cm on the left.  BMI equals 42.5 kg/m.  Preoperative bra size equals 38 J cup.  Estimated excess breast tissue removed at time of surgery equals 850 g each side.  She then completed PT without any considerable improvement in her symptoms related to macromastia and expressed that she would like to proceed with surgery.  Job: ***  PMH Significant for: ***   Past Medical History: Allergies: Allergies  Allergen Reactions   Pineapple Anaphylaxis and Shortness Of Breath   Avocado Nausea Only   Banana Nausea Only    Current Medications:  Current Outpatient Medications:    fluticasone (FLONASE) 50 MCG/ACT nasal spray, Place into both nostrils daily., Disp: , Rfl:    Insulin Pen Needle 32G X 6 MM MISC, 1 Needle by Does not apply route daily., Disp: 30 each, Rfl: 0   Liraglutide -Weight Management (SAXENDA) 18 MG/3ML SOPN, Inject 0.6 mg into the skin daily., Disp: 3 mL, Rfl: 0   montelukast (SINGULAIR) 10 MG tablet, Take 1 tablet (10 mg total) by mouth at bedtime., Disp: 90 tablet, Rfl: 3   norgestrel-ethinyl estradiol (CRYSELLE-28) 0.3-30 MG-MCG tablet, Take 1 tablet by mouth daily., Disp: 28 tablet, Rfl: 11   traZODone (DESYREL) 50 MG tablet, Take 50 mg by mouth at bedtime., Disp: , Rfl:    venlafaxine XR (EFFEXOR-XR) 150  MG 24 hr capsule, , Disp: , Rfl:    VYVANSE 30 MG capsule, Take 30 mg by mouth daily., Disp: , Rfl:   Past Medical Problems: Past Medical History:  Diagnosis Date   ADHD    Allergy    Anxiety    Back pain    Depression     Past Surgical History: Past Surgical History:  Procedure Laterality Date   ARTHROSCOPIC REPAIR ACL Right     Social History: Social History   Socioeconomic History   Marital status: Single    Spouse name: Not on file   Number of children: Not on file   Years of education: Not on file   Highest education level: Master's degree (e.g., MA, MS, MEng, MEd, MSW, MBA)  Occupational History   Not on file  Tobacco Use   Smoking status: Never   Smokeless tobacco: Never  Vaping Use   Vaping status: Never Used  Substance and Sexual Activity   Alcohol use: Yes   Drug use: Never   Sexual activity: Yes    Birth control/protection: Pill, Condom  Other Topics Concern   Not on file  Social History Narrative   Work at [employer] in [employer] for [employer] Resource Strain: Medium Risk (01/13/2023)   Overall Financial Resource Strain (CARDIA)    Difficulty of Paying Living Expenses: Somewhat hard  Food Insecurity: No Food  Insecurity (01/13/2023)   Hunger Vital Sign    Worried About Running Out of Food in the Last Year: Never true    Ran Out of Food in the Last Year: Never true  Transportation Needs: No Transportation Needs (01/13/2023)   PRAPARE - Administrator, Civil Service (Medical): No    Lack of Transportation (Non-Medical): No  Physical Activity: Sufficiently Active (01/13/2023)   Exercise Vital Sign    Days of Exercise per Week: 4 days    Minutes of Exercise per Session: 60 min  Stress: Stress Concern Present (01/13/2023)   [employer] of Occupational Health - Occupational Stress Questionnaire    Feeling of Stress : Rather much  Social Connections: Moderately Integrated  (01/13/2023)   Social Connection and Isolation Panel [NHANES]    Frequency of Communication with Friends and Family: More than three times a week    Frequency of Social Gatherings with Friends and Family: More than three times a week    Attends Religious Services: More than 4 times per year    Active Member of [employer] or Organizations: Yes    Attends Banker Meetings: More than 4 times per year    Marital Status: Never married  Intimate Partner Violence: Not At Risk (01/13/2023)   Humiliation, Afraid, Rape, and Kick questionnaire    Fear of Current or Ex-Partner: No    Emotionally Abused: No    Physically Abused: No    Sexually Abused: No    Family History: Family History  Problem Relation Age of Onset   Hypertension Mother    Hypertension Father     Review of Systems: ROS  Physical Exam: Vital Signs There were no vitals taken for this visit.  Physical Exam *** Constitutional:      General: Not in acute distress.    Appearance: Normal appearance. Not ill-appearing.  HENT:     Head: Normocephalic and atraumatic.  Eyes:     Pupils: Pupils are equal, round. Cardiovascular:     Rate and Rhythm: Normal rate.    Pulses: Normal pulses.  Pulmonary:     Effort: No respiratory distress or increased work of breathing.  Speaks in full sentences. Abdominal:     General: Abdomen is flat. No distension.   Musculoskeletal: Normal range of motion. No lower extremity swelling or edema. No varicosities. *** Skin:    General: Skin is warm and dry.     Findings: No erythema or rash.  Neurological:     Mental Status: Alert and oriented to person, place, and time.  Psychiatric:        Mood and Affect: Mood normal.        Behavior: Behavior normal.    Assessment/Plan: The patient is scheduled for *** with Dr. Jenean Lindau.  Risks, benefits, and alternatives of procedure discussed, questions answered and consent obtained.    Smoking Status:  ***; Counseling Given? *** Last Mammogram: ***; Results: ***  Caprini Score: ***; Risk Factors include: ***, BMI *** 25, and length of planned surgery. Recommendation for mechanical *** pharmacological prophylaxis. Encourage early ambulation.   Pictures obtained: ***  Post-op Rx sent to pharmacy: ***  Patient was provided with the *** General Surgical Risk consent document and Pain Medication Agreement prior to their appointment.  They had adequate time to read through the risk consent documents and Pain Medication Agreement. We also discussed them in person together during this preop appointment. All of their questions were answered to their satisfaction.  Recommended calling  if they have any further questions.  Risk consent form and Pain Medication Agreement to be scanned into patient's chart.  ***   Electronically signed by: Evelena Leyden, PA-C 05/19/2023 2:48 PM

## 2023-05-20 ENCOUNTER — Encounter: Payer: Self-pay | Admitting: Student

## 2023-05-20 ENCOUNTER — Ambulatory Visit (INDEPENDENT_AMBULATORY_CARE_PROVIDER_SITE_OTHER): Payer: BC Managed Care – PPO | Admitting: Student

## 2023-05-20 VITALS — BP 134/94 | HR 81 | Ht 63.0 in | Wt 235.0 lb

## 2023-05-20 DIAGNOSIS — N62 Hypertrophy of breast: Secondary | ICD-10-CM

## 2023-05-20 MED ORDER — OXYCODONE HCL 5 MG PO TABS: 5.0000 mg | ORAL_TABLET | Freq: Four times a day (QID) | ORAL | 0 refills | Status: DC | PRN

## 2023-05-20 MED ORDER — CEPHALEXIN 500 MG PO CAPS: 500.0000 mg | ORAL_CAPSULE | Freq: Four times a day (QID) | ORAL | 0 refills | Status: AC

## 2023-05-20 MED ORDER — ONDANSETRON HCL 4 MG PO TABS: 4.0000 mg | ORAL_TABLET | Freq: Three times a day (TID) | ORAL | 0 refills | Status: DC | PRN

## 2023-05-20 NOTE — H&P (View-Only) (Signed)
Patient ID: Tracey Cervantes, female    DOB: Dec 20, 1990, 32 y.o.   MRN: [id number]  Chief Complaint  Patient presents with   Pre-op Exam      ICD-10-CM   1. Symptomatic mammary hypertrophy  N62        History of Present Illness: Tracey Cervantes is a 32 y.o.  female  with a history of macromastia.  She presents for preoperative evaluation for upcoming procedure, bilateral breast reduction possible liposuction, scheduled for 06/02/2023 with Dr. Ulice Bold.  The patient has had problems with anesthesia.  She states that she experiences some postoperative nausea and vomiting after surgery.  She denies any other issues with anesthesia.  Patient denies any personal history of breast cancer.  She does report her great aunt on her mother side did have breast cancer.  She denies any history of cardiac disease.  She is reports she is not a smoker.  Patient reports she is currently on birth control.  She denies any history of miscarriages.  She denies any personal family history of blood clots or clotting diseases.  Patient does states she had a tooth extraction at the end of June, denies any issues since then.  She denies any other recent surgeries, traumas, infections.  She denies any history of stroke or heart attack.  She denies any history of Crohn's disease or ulcerative colitis.  She denies any history of COPD.  Patient does state that she has asthma, but it is well-controlled.  She denies any history of cancer.  She denies any varicosities to her lower extremities.  She denies any fevers or chills.  Patient reports she is currently a 28 [place], she states she would like to be a D cup.  We discussed the limitations of how much tissue can be removed at the time of surgery and that cup size cannot be guaranteed.  Patient expressed understanding.  Summary of Previous Visit: She was seen for initial consult by Dr. Ulice Bold 11/12/2022.  At that time, complained of chronic upper back and neck discomfort in  the context of large breasts.  STN 35 cm on the right, 38 cm on the left.  BMI equals 42.5 kg/m.  Preoperative bra size equals 38 J cup.  Estimated excess breast tissue removed at time of surgery equals 850 g each side.  She then completed PT without any considerable improvement in her symptoms related to macromastia and expressed that she would like to proceed with surgery.  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  I recommended that she follow-up with her primary care provider in regards to this.  Patient expressed understanding.  Patient states she is no longer taking liraglutide or Singulair.   Past Medical History: Allergies: Allergies  Allergen Reactions   Pineapple Anaphylaxis and Shortness Of Breath   Avocado Nausea Only   Banana Nausea Only    Current Medications:  Current Outpatient Medications:    fluticasone (FLONASE) 50 MCG/ACT nasal spray, Place into both nostrils daily., Disp: , Rfl:    Insulin Pen Needle 32G X 6 MM MISC, 1 Needle by Does not apply route daily., Disp: 30 each, Rfl: 0   montelukast (SINGULAIR) 10 MG tablet, Take 1 tablet (10 mg total) by mouth at bedtime., Disp: 90 tablet, Rfl: 3   norgestrel-ethinyl estradiol (CRYSELLE-28) 0.3-30 MG-MCG tablet, Take 1 tablet  by mouth daily., Disp: 28 tablet, Rfl: 11   traZODone (DESYREL) 50 MG tablet, Take 50 mg by mouth at bedtime., Disp: , Rfl:    venlafaxine XR (EFFEXOR-XR) 150 MG 24 hr capsule, , Disp: , Rfl:    VYVANSE 30 MG capsule, Take 30 mg by mouth daily., Disp: , Rfl:   Past Medical Problems: Past Medical History:  Diagnosis Date   ADHD    Allergy    Anxiety    Back pain    Depression     Past Surgical History: Past Surgical History:  Procedure Laterality Date   ARTHROSCOPIC REPAIR ACL Right     Social History: Social  History   Socioeconomic History   Marital status: Single    Spouse name: Not on file   Number of children: Not on file   Years of education: Not on file   Highest education level: Master's degree (e.g., MA, MS, MEng, MEd, MSW, MBA)  Occupational History   Not on file  Tobacco Use   Smoking status: Never   Smokeless tobacco: Never  Vaping Use   Vaping status: Never Used  Substance and Sexual Activity   Alcohol use: Yes   Drug use: Never   Sexual activity: Yes    Birth control/protection: Pill, Condom  Other Topics Concern   Not on file  Social History Narrative   Work at [employer] in [employer] for [employer] Resource Strain: Medium Risk (01/13/2023)   Overall Financial Resource Strain (CARDIA)    Difficulty of Paying Living Expenses: Somewhat hard  Food Insecurity: No Food Insecurity (01/13/2023)   Hunger Vital Sign    Worried About Running Out of Food in the Last Year: Never true    Ran Out of Food in the Last Year: Never true  Transportation Needs: No Transportation Needs (01/13/2023)   PRAPARE - Administrator, Civil Service (Medical): No    Lack of Transportation (Non-Medical): No  Physical Activity: Sufficiently Active (01/13/2023)   Exercise Vital Sign    Days of Exercise per Week: 4 days    Minutes of Exercise per Session: 60 min  Stress: Stress Concern Present (01/13/2023)   [employer] of Occupational Health - Occupational Stress Questionnaire    Feeling of Stress : Rather much  Social Connections: Moderately Integrated (01/13/2023)   Social Connection and Isolation Panel [NHANES]    Frequency of Communication with Friends and Family: More than three times a week    Frequency of Social Gatherings with Friends and Family: More than three times a week    Attends Religious Services: More than 4 times per year    Active Member of [employer] or Organizations: Yes    Attends Hospital doctor: More than 4 times per year    Marital Status: Never married  Intimate Partner Violence: Not At Risk (01/13/2023)   Humiliation, Afraid, Rape, and Kick questionnaire    Fear of Current or Ex-Partner: No    Emotionally Abused: No    Physically Abused: No    Sexually Abused: No    Family History: Family History  Problem Relation Age of Onset   Hypertension Mother    Hypertension Father     Review of Systems: Denies any fevers or chills or changes in her health Physical Exam: Vital Signs BP (!) 134/94 (BP Location: Right Arm, Patient Position: Sitting, Cuff Size: Large)   Pulse 81   Ht 5\' 3"  (  1.6 m)   Wt 235 lb (106.6 kg)   SpO2 99%   BMI 41.63 kg/m   Physical Exam  Constitutional:      General: Not in acute distress.    Appearance: Normal appearance. Not ill-appearing.  HENT:     Head: Normocephalic and atraumatic.  Cardiovascular:     Rate and Rhythm: Normal rate. Pulmonary:     Effort: No respiratory distress or increased work of breathing.  Speaks in full sentences. Musculoskeletal: Normal range of motion.  Skin:    General: Skin is warm and dry.     Findings: No erythema or rash.  Neurological:     Mental Status: Alert and oriented to person, place, and time.  Psychiatric:        Mood and Affect: Mood normal.        Behavior: Behavior normal.    Assessment/Plan: The patient is scheduled for bilateral breast reduction with liposuction with Dr. Ulice Bold.  Risks, benefits, and alternatives of procedure discussed, questions answered and consent obtained.    Smoking Status: Non-smoker; Counseling Given?  N/A Last Mammogram: N/A due to age  Caprini Score: 3; Risk Factors include: BMI > 25, and length of planned surgery. Recommendation for mechanical prophylaxis. Encourage early ambulation.   Pictures obtained:@Consult   Post-op Rx sent to pharmacy: Oxycodone, Zofran, Keflex  I instructed the patient to hold her Vyvanse the day of surgery and hold her  trazodone the day of surgery as well.    Discussed with the patient that estrogen may increase her risk of developing blood clots after surgery.  I recommended that she hold her birth control 2 weeks before and 2 weeks after surgery.  Patient expressed understanding.  Patient also stated that she is going to [place], [place] after surgery to stay with her mom.  I recommended that she not leave until the day after surgery and I recommended that during her travels, she make a stop every 30 minutes to 1 hour so that patient may walk around.  Discussed with her that traveling after surgery may increase her risk of developing blood clots.  Patient expressed understanding.  Patient was provided with the General Surgical Risk consent document and Pain Medication Agreement prior to their appointment.  They had adequate time to read through the risk consent documents and Pain Medication Agreement. We also discussed them in person together during this preop appointment. All of their questions were answered to their satisfaction.  Recommended calling if they have any further questions.  Risk consent form and Pain Medication Agreement to be scanned into patient's chart.  The consent was obtained with risks and complications reviewed which included bleeding, pain, scar, infection and the risk of anesthesia.  The patients questions were answered to the patients expressed satisfaction.    Electronically signed by: Laurena Spies, PA-C 05/20/2023 12:00 PM

## 2023-05-20 NOTE — Progress Notes (Signed)
Patient ID: Tracey Cervantes, female    DOB: Dec 20, 1990, 32 y.o.   MRN: [id number]  Chief Complaint  Patient presents with   Pre-op Exam      ICD-10-CM   1. Symptomatic mammary hypertrophy  N62        History of Present Illness: Tracey Cervantes is a 32 y.o.  female  with a history of macromastia.  She presents for preoperative evaluation for upcoming procedure, bilateral breast reduction possible liposuction, scheduled for 06/02/2023 with Dr. Ulice Bold.  The patient has had problems with anesthesia.  She states that she experiences some postoperative nausea and vomiting after surgery.  She denies any other issues with anesthesia.  Patient denies any personal history of breast cancer.  She does report her great aunt on her mother side did have breast cancer.  She denies any history of cardiac disease.  She is reports she is not a smoker.  Patient reports she is currently on birth control.  She denies any history of miscarriages.  She denies any personal family history of blood clots or clotting diseases.  Patient does states she had a tooth extraction at the end of June, denies any issues since then.  She denies any other recent surgeries, traumas, infections.  She denies any history of stroke or heart attack.  She denies any history of Crohn's disease or ulcerative colitis.  She denies any history of COPD.  Patient does state that she has asthma, but it is well-controlled.  She denies any history of cancer.  She denies any varicosities to her lower extremities.  She denies any fevers or chills.  Patient reports she is currently a 28 [place], she states she would like to be a D cup.  We discussed the limitations of how much tissue can be removed at the time of surgery and that cup size cannot be guaranteed.  Patient expressed understanding.  Summary of Previous Visit: She was seen for initial consult by Dr. Ulice Bold 11/12/2022.  At that time, complained of chronic upper back and neck discomfort in  the context of large breasts.  STN 35 cm on the right, 38 cm on the left.  BMI equals 42.5 kg/m.  Preoperative bra size equals 38 J cup.  Estimated excess breast tissue removed at time of surgery equals 850 g each side.  She then completed PT without any considerable improvement in her symptoms related to macromastia and expressed that she would like to proceed with surgery.  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  I recommended that she follow-up with her primary care provider in regards to this.  Patient expressed understanding.  Patient states she is no longer taking liraglutide or Singulair.   Past Medical History: Allergies: Allergies  Allergen Reactions   Pineapple Anaphylaxis and Shortness Of Breath   Avocado Nausea Only   Banana Nausea Only    Current Medications:  Current Outpatient Medications:    fluticasone (FLONASE) 50 MCG/ACT nasal spray, Place into both nostrils daily., Disp: , Rfl:    Insulin Pen Needle 32G X 6 MM MISC, 1 Needle by Does not apply route daily., Disp: 30 each, Rfl: 0   montelukast (SINGULAIR) 10 MG tablet, Take 1 tablet (10 mg total) by mouth at bedtime., Disp: 90 tablet, Rfl: 3   norgestrel-ethinyl estradiol (CRYSELLE-28) 0.3-30 MG-MCG tablet, Take 1 tablet  by mouth daily., Disp: 28 tablet, Rfl: 11   traZODone (DESYREL) 50 MG tablet, Take 50 mg by mouth at bedtime., Disp: , Rfl:    venlafaxine XR (EFFEXOR-XR) 150 MG 24 hr capsule, , Disp: , Rfl:    VYVANSE 30 MG capsule, Take 30 mg by mouth daily., Disp: , Rfl:   Past Medical Problems: Past Medical History:  Diagnosis Date   ADHD    Allergy    Anxiety    Back pain    Depression     Past Surgical History: Past Surgical History:  Procedure Laterality Date   ARTHROSCOPIC REPAIR ACL Right     Social History: Social  History   Socioeconomic History   Marital status: Single    Spouse name: Not on file   Number of children: Not on file   Years of education: Not on file   Highest education level: Master's degree (e.g., MA, MS, MEng, MEd, MSW, MBA)  Occupational History   Not on file  Tobacco Use   Smoking status: Never   Smokeless tobacco: Never  Vaping Use   Vaping status: Never Used  Substance and Sexual Activity   Alcohol use: Yes   Drug use: Never   Sexual activity: Yes    Birth control/protection: Pill, Condom  Other Topics Concern   Not on file  Social History Narrative   Work at [employer] in [employer] for [employer] Resource Strain: Medium Risk (01/13/2023)   Overall Financial Resource Strain (CARDIA)    Difficulty of Paying Living Expenses: Somewhat hard  Food Insecurity: No Food Insecurity (01/13/2023)   Hunger Vital Sign    Worried About Running Out of Food in the Last Year: Never true    Ran Out of Food in the Last Year: Never true  Transportation Needs: No Transportation Needs (01/13/2023)   PRAPARE - Administrator, Civil Service (Medical): No    Lack of Transportation (Non-Medical): No  Physical Activity: Sufficiently Active (01/13/2023)   Exercise Vital Sign    Days of Exercise per Week: 4 days    Minutes of Exercise per Session: 60 min  Stress: Stress Concern Present (01/13/2023)   [employer] of Occupational Health - Occupational Stress Questionnaire    Feeling of Stress : Rather much  Social Connections: Moderately Integrated (01/13/2023)   Social Connection and Isolation Panel [NHANES]    Frequency of Communication with Friends and Family: More than three times a week    Frequency of Social Gatherings with Friends and Family: More than three times a week    Attends Religious Services: More than 4 times per year    Active Member of [employer] or Organizations: Yes    Attends Hospital doctor: More than 4 times per year    Marital Status: Never married  Intimate Partner Violence: Not At Risk (01/13/2023)   Humiliation, Afraid, Rape, and Kick questionnaire    Fear of Current or Ex-Partner: No    Emotionally Abused: No    Physically Abused: No    Sexually Abused: No    Family History: Family History  Problem Relation Age of Onset   Hypertension Mother    Hypertension Father     Review of Systems: Denies any fevers or chills or changes in her health Physical Exam: Vital Signs BP (!) 134/94 (BP Location: Right Arm, Patient Position: Sitting, Cuff Size: Large)   Pulse 81   Ht 5\' 3"  (  1.6 m)   Wt 235 lb (106.6 kg)   SpO2 99%   BMI 41.63 kg/m   Physical Exam  Constitutional:      General: Not in acute distress.    Appearance: Normal appearance. Not ill-appearing.  HENT:     Head: Normocephalic and atraumatic.  Cardiovascular:     Rate and Rhythm: Normal rate. Pulmonary:     Effort: No respiratory distress or increased work of breathing.  Speaks in full sentences. Musculoskeletal: Normal range of motion.  Skin:    General: Skin is warm and dry.     Findings: No erythema or rash.  Neurological:     Mental Status: Alert and oriented to person, place, and time.  Psychiatric:        Mood and Affect: Mood normal.        Behavior: Behavior normal.    Assessment/Plan: The patient is scheduled for bilateral breast reduction with liposuction with Dr. Ulice Bold.  Risks, benefits, and alternatives of procedure discussed, questions answered and consent obtained.    Smoking Status: Non-smoker; Counseling Given?  N/A Last Mammogram: N/A due to age  Caprini Score: 3; Risk Factors include: BMI > 25, and length of planned surgery. Recommendation for mechanical prophylaxis. Encourage early ambulation.   Pictures obtained:@Consult   Post-op Rx sent to pharmacy: Oxycodone, Zofran, Keflex  I instructed the patient to hold her Vyvanse the day of surgery and hold her  trazodone the day of surgery as well.    Discussed with the patient that estrogen may increase her risk of developing blood clots after surgery.  I recommended that she hold her birth control 2 weeks before and 2 weeks after surgery.  Patient expressed understanding.  Patient also stated that she is going to [place], [place] after surgery to stay with her mom.  I recommended that she not leave until the day after surgery and I recommended that during her travels, she make a stop every 30 minutes to 1 hour so that patient may walk around.  Discussed with her that traveling after surgery may increase her risk of developing blood clots.  Patient expressed understanding.  Patient was provided with the General Surgical Risk consent document and Pain Medication Agreement prior to their appointment.  They had adequate time to read through the risk consent documents and Pain Medication Agreement. We also discussed them in person together during this preop appointment. All of their questions were answered to their satisfaction.  Recommended calling if they have any further questions.  Risk consent form and Pain Medication Agreement to be scanned into patient's chart.  The consent was obtained with risks and complications reviewed which included bleeding, pain, scar, infection and the risk of anesthesia.  The patients questions were answered to the patients expressed satisfaction.    Electronically signed by: Laurena Spies, PA-C 05/20/2023 12:00 PM

## 2023-05-26 ENCOUNTER — Encounter (HOSPITAL_BASED_OUTPATIENT_CLINIC_OR_DEPARTMENT_OTHER): Payer: Self-pay | Admitting: Plastic Surgery

## 2023-05-26 ENCOUNTER — Other Ambulatory Visit: Payer: Self-pay

## 2023-06-02 ENCOUNTER — Ambulatory Visit (HOSPITAL_BASED_OUTPATIENT_CLINIC_OR_DEPARTMENT_OTHER): Payer: BC Managed Care – PPO | Admitting: Anesthesiology

## 2023-06-02 ENCOUNTER — Other Ambulatory Visit: Payer: Self-pay

## 2023-06-02 ENCOUNTER — Ambulatory Visit (HOSPITAL_BASED_OUTPATIENT_CLINIC_OR_DEPARTMENT_OTHER)
Admission: RE | Admit: 2023-06-02 | Discharge: 2023-06-02 | Disposition: A | Discharge status: Home / Self Care | Payer: BC Managed Care – PPO | Attending: Plastic Surgery | Admitting: Plastic Surgery

## 2023-06-02 ENCOUNTER — Encounter (HOSPITAL_BASED_OUTPATIENT_CLINIC_OR_DEPARTMENT_OTHER)
Admission: RE | Disposition: A | Discharge status: Home / Self Care | Payer: Self-pay | Source: Home / Self Care | Attending: Plastic Surgery

## 2023-06-02 ENCOUNTER — Encounter (HOSPITAL_BASED_OUTPATIENT_CLINIC_OR_DEPARTMENT_OTHER): Payer: Self-pay | Admitting: Plastic Surgery

## 2023-06-02 DIAGNOSIS — J45909 Unspecified asthma, uncomplicated: Secondary | ICD-10-CM | POA: Diagnosis not present

## 2023-06-02 DIAGNOSIS — Z803 Family history of malignant neoplasm of breast: Secondary | ICD-10-CM | POA: Insufficient documentation

## 2023-06-02 DIAGNOSIS — N62 Hypertrophy of breast: Secondary | ICD-10-CM | POA: Insufficient documentation

## 2023-06-02 DIAGNOSIS — F411 Generalized anxiety disorder: Secondary | ICD-10-CM | POA: Insufficient documentation

## 2023-06-02 DIAGNOSIS — F32A Depression, unspecified: Secondary | ICD-10-CM | POA: Diagnosis not present

## 2023-06-02 DIAGNOSIS — F909 Attention-deficit hyperactivity disorder, unspecified type: Secondary | ICD-10-CM | POA: Diagnosis not present

## 2023-06-02 DIAGNOSIS — Z6841 Body Mass Index (BMI) 40.0 and over, adult: Secondary | ICD-10-CM | POA: Diagnosis not present

## 2023-06-02 DIAGNOSIS — Z5986 Financial insecurity: Secondary | ICD-10-CM | POA: Insufficient documentation

## 2023-06-02 DIAGNOSIS — Z01818 Encounter for other preprocedural examination: Secondary | ICD-10-CM

## 2023-06-02 HISTORY — DX: Unspecified asthma, uncomplicated: J45.909

## 2023-06-02 HISTORY — DX: Other specified postprocedural states: Z98.890

## 2023-06-02 HISTORY — DX: Other specified postprocedural states: R11.2

## 2023-06-02 HISTORY — PX: BREAST REDUCTION SURGERY: SHX8

## 2023-06-02 LAB — POCT PREGNANCY, URINE: Preg Test, Ur: NEGATIVE

## 2023-06-02 SURGERY — BREAST REDUCTION WITH LIPOSUCTION
Anesthesia: General | Site: Breast | Laterality: Bilateral

## 2023-06-02 MED ORDER — EPHEDRINE SULFATE (PRESSORS) 50 MG/ML IJ SOLN
INTRAMUSCULAR | Status: DC | PRN
  Administered 2023-06-02: 10 mg via INTRAVENOUS

## 2023-06-02 MED ORDER — LIDOCAINE HCL 1 % IJ SOLN
INTRAVENOUS | Status: DC | PRN
  Administered 2023-06-02: 600 mL

## 2023-06-02 MED ORDER — HYDROMORPHONE HCL 1 MG/ML IJ SOLN
INTRAMUSCULAR | Status: AC
  Filled 2023-06-02: qty 0.5

## 2023-06-02 MED ORDER — PROPOFOL 10 MG/ML IV BOLUS
INTRAVENOUS | Status: AC
  Filled 2023-06-02: qty 20

## 2023-06-02 MED ORDER — PROPOFOL 10 MG/ML IV BOLUS
INTRAVENOUS | Status: DC | PRN
Start: 2023-06-02 — End: 2023-06-02
  Administered 2023-06-02: 200 mg via INTRAVENOUS

## 2023-06-02 MED ORDER — HYDROMORPHONE HCL 1 MG/ML IJ SOLN
0.2500 mg | INTRAMUSCULAR | Status: DC | PRN
  Administered 2023-06-02: 0.5 mg via INTRAVENOUS

## 2023-06-02 MED ORDER — LACTATED RINGERS IV SOLN: INTRAVENOUS | Status: DC

## 2023-06-02 MED ORDER — MIDAZOLAM HCL 2 MG/2ML IJ SOLN
INTRAMUSCULAR | Status: AC
  Filled 2023-06-02: qty 2

## 2023-06-02 MED ORDER — DEXAMETHASONE SODIUM PHOSPHATE 4 MG/ML IJ SOLN
INTRAMUSCULAR | Status: DC | PRN
  Administered 2023-06-02: 5 mg via INTRAVENOUS

## 2023-06-02 MED ORDER — PROMETHAZINE HCL 25 MG/ML IJ SOLN: 6.2500 mg | INTRAMUSCULAR | Status: DC | PRN

## 2023-06-02 MED ORDER — AMISULPRIDE (ANTIEMETIC) 5 MG/2ML IV SOLN: 10.0000 mg | Freq: Once | INTRAVENOUS | Status: DC | PRN

## 2023-06-02 MED ORDER — ONDANSETRON HCL 4 MG/2ML IJ SOLN
INTRAMUSCULAR | Status: DC | PRN
Start: 2023-06-02 — End: 2023-06-02
  Administered 2023-06-02: 4 mg via INTRAVENOUS

## 2023-06-02 MED ORDER — CEFAZOLIN SODIUM-DEXTROSE 2-4 GM/100ML-% IV SOLN
2.0000 g | INTRAVENOUS | Status: AC
  Administered 2023-06-02: 2 g via INTRAVENOUS

## 2023-06-02 MED ORDER — MIDAZOLAM HCL 5 MG/5ML IJ SOLN
INTRAMUSCULAR | Status: DC | PRN
  Administered 2023-06-02: 2 mg via INTRAVENOUS

## 2023-06-02 MED ORDER — SODIUM CHLORIDE 0.9 % IV SOLN
INTRAVENOUS | Status: DC | PRN
  Administered 2023-06-02: 40 mL

## 2023-06-02 MED ORDER — OXYCODONE HCL 5 MG PO TABS: 5.0000 mg | ORAL_TABLET | Freq: Once | ORAL | Status: DC | PRN

## 2023-06-02 MED ORDER — SODIUM CHLORIDE (PF) 0.9 % IJ SOLN
INTRAMUSCULAR | Status: DC | PRN
  Administered 2023-06-02: 20 mL

## 2023-06-02 MED ORDER — OXYCODONE HCL 5 MG/5ML PO SOLN: 5.0000 mg | Freq: Once | ORAL | Status: DC | PRN

## 2023-06-02 MED ORDER — LIDOCAINE-EPINEPHRINE 1 %-1:100000 IJ SOLN
INTRAMUSCULAR | Status: DC | PRN
  Administered 2023-06-02: 50 mL

## 2023-06-02 MED ORDER — CHLORHEXIDINE GLUCONATE CLOTH 2 % EX PADS: 6.0000 | MEDICATED_PAD | Freq: Once | CUTANEOUS | Status: DC

## 2023-06-02 MED ORDER — VASHE WOUND IRRIGATION OPTIME
TOPICAL | Status: DC | PRN
Start: 2023-06-02 — End: 2023-06-02
  Administered 2023-06-02: 34 [oz_av]

## 2023-06-02 MED ORDER — MEPERIDINE HCL 25 MG/ML IJ SOLN: 6.2500 mg | INTRAMUSCULAR | Status: DC | PRN

## 2023-06-02 MED ORDER — FENTANYL CITRATE (PF) 100 MCG/2ML IJ SOLN
INTRAMUSCULAR | Status: AC
  Filled 2023-06-02: qty 2

## 2023-06-02 MED ORDER — CEFAZOLIN SODIUM-DEXTROSE 2-4 GM/100ML-% IV SOLN
INTRAVENOUS | Status: AC
  Filled 2023-06-02: qty 100

## 2023-06-02 MED ORDER — HYDROMORPHONE HCL 1 MG/ML IJ SOLN
INTRAMUSCULAR | Status: DC | PRN
Start: 2023-06-02 — End: 2023-06-02
  Administered 2023-06-02: .5 mg via INTRAVENOUS

## 2023-06-02 MED ORDER — LIDOCAINE 2% (20 MG/ML) 5 ML SYRINGE
INTRAMUSCULAR | Status: DC | PRN
  Administered 2023-06-02: 60 mg via INTRAVENOUS

## 2023-06-02 MED ORDER — PHENYLEPHRINE HCL (PRESSORS) 10 MG/ML IV SOLN
INTRAVENOUS | Status: DC | PRN
  Administered 2023-06-02 (×3): 160 ug via INTRAVENOUS

## 2023-06-02 MED ORDER — FENTANYL CITRATE (PF) 100 MCG/2ML IJ SOLN
INTRAMUSCULAR | Status: DC | PRN
  Administered 2023-06-02 (×2): 50 ug via INTRAVENOUS
  Administered 2023-06-02: 25 ug via INTRAVENOUS
  Administered 2023-06-02: 50 ug via INTRAVENOUS
  Administered 2023-06-02: 25 ug via INTRAVENOUS

## 2023-06-02 SURGICAL SUPPLY — 68 items
ADH SKN CLS APL DERMABOND .7 (GAUZE/BANDAGES/DRESSINGS) ×2
AGENT HMST PWDR BTL CLGN 5GM (Miscellaneous) ×1 IMPLANT
BAG DECANTER FOR FLEXI CONT (MISCELLANEOUS) ×1 IMPLANT
BINDER BREAST LRG (GAUZE/BANDAGES/DRESSINGS) IMPLANT
BINDER BREAST MEDIUM (GAUZE/BANDAGES/DRESSINGS) IMPLANT
BINDER BREAST XLRG (GAUZE/BANDAGES/DRESSINGS) IMPLANT
BINDER BREAST XXLRG (GAUZE/BANDAGES/DRESSINGS) IMPLANT
BIOPATCH RED 1 DISK 7.0 (GAUZE/BANDAGES/DRESSINGS) IMPLANT
BLADE HEX COATED 2.75 (ELECTRODE) IMPLANT
BLADE KNIFE PERSONA 10 (BLADE) ×2 IMPLANT
BLADE SURG 15 STRL LF DISP TIS (BLADE) ×1 IMPLANT
BLADE SURG 15 STRL SS (BLADE) ×1
CANISTER SUCT 1200ML W/VALVE (MISCELLANEOUS) ×1 IMPLANT
CLEANSER WND VASHE 34 (WOUND CARE) IMPLANT
COLLAGEN CELLERATERX 5 GRAM (Miscellaneous) IMPLANT
COVER BACK TABLE 60X90IN (DRAPES) ×1 IMPLANT
COVER MAYO STAND STRL (DRAPES) ×1 IMPLANT
DERMABOND ADVANCED .7 DNX12 (GAUZE/BANDAGES/DRESSINGS) ×2 IMPLANT
DRAIN CHANNEL 19F RND (DRAIN) IMPLANT
DRAPE LAPAROSCOPIC ABDOMINAL (DRAPES) ×1 IMPLANT
DRSG MEPILEX POST OP 4X8 (GAUZE/BANDAGES/DRESSINGS) ×2 IMPLANT
ELECT BLADE 4.0 EZ CLEAN MEGAD (MISCELLANEOUS) ×1
ELECT REM PT RETURN 9FT ADLT (ELECTROSURGICAL) ×1
ELECTRODE BLDE 4.0 EZ CLN MEGD (MISCELLANEOUS) ×1 IMPLANT
ELECTRODE REM PT RTRN 9FT ADLT (ELECTROSURGICAL) ×1 IMPLANT
EVACUATOR SILICONE 100CC (DRAIN) IMPLANT
GAUZE PAD ABD 8X10 STRL (GAUZE/BANDAGES/DRESSINGS) ×2 IMPLANT
GLOVE BIO SURGEON STRL SZ 6.5 (GLOVE) ×3 IMPLANT
GLOVE BIO SURGEON STRL SZ7.5 (GLOVE) ×1 IMPLANT
GLOVE BIOGEL PI IND STRL 8 (GLOVE) IMPLANT
GLOVE SURG SS PI 6.5 STRL IVOR (GLOVE) IMPLANT
GLOVE SURG SS PI 7.5 STRL IVOR (GLOVE) IMPLANT
GOWN STRL REUS W/ TWL LRG LVL3 (GOWN DISPOSABLE) ×2 IMPLANT
GOWN STRL REUS W/ TWL XL LVL3 (GOWN DISPOSABLE) ×1 IMPLANT
GOWN STRL REUS W/TWL LRG LVL3 (GOWN DISPOSABLE) ×2
GOWN STRL REUS W/TWL XL LVL3 (GOWN DISPOSABLE) ×2
NDL FILTER BLUNT 18X1 1/2 (NEEDLE) IMPLANT
NDL HYPO 25X1 1.5 SAFETY (NEEDLE) ×1 IMPLANT
NEEDLE FILTER BLUNT 18X1 1/2 (NEEDLE)
NEEDLE HYPO 25X1 1.5 SAFETY (NEEDLE) ×2
NS IRRIG 1000ML POUR BTL (IV SOLUTION) ×1 IMPLANT
PACK BASIN DAY SURGERY FS (CUSTOM PROCEDURE TRAY) ×1 IMPLANT
PAD ALCOHOL SWAB (MISCELLANEOUS) IMPLANT
PAD FOAM SILICONE BACKED (GAUZE/BANDAGES/DRESSINGS) IMPLANT
PENCIL SMOKE EVACUATOR (MISCELLANEOUS) ×1 IMPLANT
PIN SAFETY STERILE (MISCELLANEOUS) IMPLANT
SLEEVE SCD COMPRESS KNEE MED (STOCKING) ×1 IMPLANT
SPIKE FLUID TRANSFER (MISCELLANEOUS) IMPLANT
SPONGE T-LAP 18X18 ~~LOC~~+RFID (SPONGE) ×2 IMPLANT
STRIP SUTURE WOUND CLOSURE 1/2 (MISCELLANEOUS) ×2 IMPLANT
SUT MNCRL AB 4-0 PS2 18 (SUTURE) ×4 IMPLANT
SUT MON AB 3-0 SH 27 (SUTURE) ×6
SUT MON AB 3-0 SH27 (SUTURE) ×4 IMPLANT
SUT MON AB 5-0 PS2 18 (SUTURE) IMPLANT
SUT PDS 3-0 CT2 (SUTURE) ×6
SUT PDS II 3-0 CT2 27 ABS (SUTURE) ×4 IMPLANT
SUT SILK 3 0 PS 1 (SUTURE) IMPLANT
SYR 50ML LL SCALE MARK (SYRINGE) IMPLANT
SYR BULB IRRIG 60ML STRL (SYRINGE) ×1 IMPLANT
SYR CONTROL 10ML LL (SYRINGE) ×1 IMPLANT
TAPE MEASURE VINYL STERILE (MISCELLANEOUS) IMPLANT
TOWEL GREEN STERILE FF (TOWEL DISPOSABLE) ×2 IMPLANT
TRAY DSU PREP LF (CUSTOM PROCEDURE TRAY) ×1 IMPLANT
TUBE CONNECTING 20X1/4 (TUBING) ×1 IMPLANT
TUBING INFILTRATION IT-10001 (TUBING) IMPLANT
TUBING SET GRADUATE ASPIR 12FT (MISCELLANEOUS) IMPLANT
UNDERPAD 30X36 HEAVY ABSORB (UNDERPADS AND DIAPERS) ×2 IMPLANT
YANKAUER SUCT BULB TIP NO VENT (SUCTIONS) ×1 IMPLANT

## 2023-06-02 NOTE — Op Note (Signed)
Breast Reduction Op note:    DATE OF PROCEDURE: 06/02/2023  LOCATION: [facility]  SURGEON: Foster Simpson, DO  ASSISTANT: Keenan Bachelor, PA  PREOPERATIVE DIAGNOSIS 1. Macromastia 2. Neck Pain 3. Back Pain  POSTOPERATIVE DIAGNOSIS 1. Macromastia 2. Neck Pain 3. Back Pain  PROCEDURES 1. Bilateral breast reduction.  Right reduction 1310 g, Left reduction 1347 g  COMPLICATIONS: None.  DRAINS: none  INDICATIONS FOR PROCEDURE Tracey Cervantes is a 32 y.o. year-old female born on 04-29-1991,with a history of symptomatic macromastia with concominant back pain, neck pain, shoulder grooving from her bra.   MRN: [id number]  CONSENT Informed consent was obtained directly from the patient. The risks, benefits and alternatives were fully discussed. Specific risks including but not limited to bleeding, infection, hematoma, seroma, scarring, pain, nipple necrosis, asymmetry, poor cosmetic results, and need for further surgery were discussed. The patient's questions were answered.  DESCRIPTION OF PROCEDURE  Patient was brought into the operating room and rested on the operating room table in the supine position.  SCDs were placed and appropriate padding was performed.  Antibiotics were given. The patient underwent general anesthesia and the chest was prepped and draped in a sterile fashion.  A timeout was performed and all information was confirmed to be correct by those in the room. Tumescent was placed laterally in each breast and liposuction done for improved contour.  Right side: Preoperative markings were confirmed.  Incision lines were injected with local containing epinephrine.  After waiting for vasoconstriction, the marked lines were incised with a #15 blade.  A Wise-pattern superomedial breast reduction was performed by de-epithelializing the pedicle, using bovie to create the superomedial pedicle, and removing breast tissue from the superior, lateral, and  inferior portions of the breast.  Experel and cellerat were placed in the pocket. Care was taken to not undermine the breast pedicle. Hemostasis was achieved.  The nipple was gently rotated into position and the soft tissue closed with 4-0 Monocryl.   The pocket was irrigated and hemostasis confirmed.  The deep tissues were approximated with 3-0 PDS sutures.  The skin was closed with deep dermal 3-0 Monocryl and subcuticular 4-0 Monocryl sutures.  The nipple and skin flaps had good capillary refill at the end of the procedure.    Left side: Preoperative markings were confirmed.  Incision lines were injected with local containing epinephrine.  After waiting for vasoconstriction, the marked lines were incised with a #15 blade.  A Wise-pattern superomedial breast reduction was performed by de-epithelializing the pedicle, using bovie to create the superomedial pedicle, and removing breast tissue from the superior, lateral, and inferior portions of the breast.  Experel and cellerate were placed in the pocket. Care was taken to not undermine the breast pedicle. Hemostasis was achieved.  The nipple was gently rotated into position and the soft tissue was closed with 4-0 Monocryl.  The patient was sat upright and size and shape symmetry was confirmed.  The pocket was irrigated and hemostasis confirmed.  The deep tissues were approximated with 3-0 PDS sutures. The skin was closed with deep dermal 3-0 Monocryl and subcuticular 4-0 Monocryl sutures.  Dermabond was applied.  A breast binder and ABDs were placed.  The nipple and skin flaps had good capillary refill at the end of the procedure.  The patient tolerated the procedure well. The patient was allowed to wake from anesthesia and taken to the recovery room in satisfactory condition.  The advanced practice practitioner (APP) assisted throughout the case.  The APP was essential in retraction and counter traction when needed to make the case progress smoothly.  This  retraction and assistance made it possible to see the tissue plans for the procedure.  The assistance was needed for blood control, tissue re-approximation and assisted with closure of the incision site.

## 2023-06-02 NOTE — Discharge Instructions (Addendum)
Post Anesthesia Home Care Instructions  Activity: Get plenty of rest for the remainder of the day. A responsible individual must stay with you for 24 hours following the procedure.  For the next 24 hours, DO NOT: -Drive a car -Advertising copywriter -Drink alcoholic beverages -Take any medication unless instructed by your physician -Make any legal decisions or sign important papers.  Meals: Start with liquid foods such as gelatin or soup. Progress to regular foods as tolerated. Avoid greasy, spicy, heavy foods. If nausea and/or vomiting occur, drink only clear liquids until the nausea and/or vomiting subsides. Call your physician if vomiting continues.  Special Instructions/Symptoms: Your throat may feel dry or sore from the anesthesia or the breathing tube placed in your throat during surgery. If this causes discomfort, gargle with warm salt water. The discomfort should disappear within 24 hours.  If you had a scopolamine patch placed behind your ear for the management of post- operative nausea and/or vomiting:  1. The medication in the patch is effective for 72 hours, after which it should be removed.  Wrap patch in a tissue and discard in the trash. Wash hands thoroughly with soap and water. 2. You may remove the patch earlier than 72 hours if you experience unpleasant side effects which may include dry mouth, dizziness or visual disturbances. 3. Avoid touching the patch. Wash your hands with soap and water after contact with the patch.       Information for Discharge Teaching: EXPAREL (bupivacaine liposome injectable suspension)   Your surgeon or anesthesiologist gave you EXPAREL(bupivacaine) to help control your pain after surgery.  EXPAREL is a local anesthetic that provides pain relief by numbing the tissue around the surgical site. EXPAREL is designed to release pain medication over time and can control pain for up to 72 hours. Depending on how you respond to EXPAREL, you may  require less pain medication during your recovery.  Possible side effects: Temporary loss of sensation or ability to move in the area where bupivacaine was injected. Nausea, vomiting, constipation Rarely, numbness and tingling in your mouth or lips, lightheadedness, or anxiety may occur. Call your doctor right away if you think you may be experiencing any of these sensations, or if you have other questions regarding possible side effects.  Follow all other discharge instructions given to you by your surgeon or nurse. Eat a healthy diet and drink plenty of water or other fluids.  If you return to the hospital for any reason within 96 hours following the administration of EXPAREL, it is important for health care providers to know that you have received this anesthetic. A teal colored band has been placed on your arm with the date, time and amount of EXPAREL you have received in order to alert and inform your health care providers. Please leave this armband in place for the full 96 hours following administration, and then you may remove the band.    INSTRUCTIONS FOR AFTER BREAST SURGERY   You will likely have some questions about what to expect following your operation.  The following information will help you and your family understand what to expect when you are discharged from the hospital.  It is important to follow these guidelines to help ensure a smooth recovery and reduce complication.  Postoperative instructions include information on: diet, wound care, medications and physical activity.  AFTER SURGERY Expect to go home after the procedure.  In some cases, you may need to spend one night in the hospital for observation.  DIET  Breast surgery does not require a specific diet.  However, the healthier you eat the better your body will heal. It is important to increasing your protein intake.  This means limiting the foods with sugar and carbohydrates.  Focus on vegetables and some meat.  If  you have liposuction during your procedure be sure to drink water.  If your urine is bright yellow, then it is concentrated, and you need to drink more water.  As a general rule after surgery, you should have 8 ounces of water every hour while awake.  If you find you are persistently nauseated or unable to take in liquids let us know.  NO TOBACCO USE or EXPOSURE.  This will slow your healing process and lead to a wound.  WOUND CARE Leave the binder on for 3 days . Use fragrance free soap like Dial, Dove or [place].   After 3 days you can remove the binder to shower. Once dry apply binder or sports bra. If you have liposuction you will have a soft and spongy dressing (Lipofoam) that helps prevent creases in your skin.  Remove before you shower and then replace it.  It is also available on [employer]. If you have steri-strips / tape directly attached to your skin leave them in place. It is OK to get these wet.   No baths, pools or hot tubs for four weeks. We close your incision to leave the smallest and best-looking scar. No ointment or creams on your incisions for four weeks.  No Neosporin (Too many skin reactions).  A few weeks after surgery you can use Mederma and start massaging the scar. We ask you to wear your binder or sports bra for the first 6 weeks around the clock, including while sleeping. This provides added comfort and helps reduce the fluid accumulation at the surgery site. NO Ice or heating pads to the operative site.  You have a very high risk of a BURN before you feel the temperature change.  ACTIVITY No heavy lifting until cleared by the doctor.  This usually means no more than a half-gallon of milk.  It is OK to walk and climb stairs. Moving your legs is very important to decrease your risk of a blood clot.  It will also help keep you from getting deconditioned.  Every 1 to 2 hours get up and walk for 5 minutes. This will help with a quicker recovery back to normal.  Let pain be your guide  so you don't do too much.  This time is for you to recover.  You will be more comfortable if you sleep and rest with your head elevated either with a few pillows under you or in a recliner.  No stomach sleeping for a three months.  WORK Everyone returns to work at different times. As a rough guide, most people take at least 1 - 2 weeks off prior to returning to work. If you need documentation for your job, give the forms to the front staff at the clinic.  DRIVING Arrange for someone to bring you home from the hospital after your surgery.  You may be able to drive a few days after surgery but not while taking any narcotics or valium.  BOWEL MOVEMENTS Constipation can occur after anesthesia and while taking pain medication.  It is important to stay ahead for your comfort.  We recommend taking Milk of Magnesia (2 tablespoons; twice a day) while taking the pain pills.  MEDICATIONS You may be prescribed should  start after surgery At your preoperative visit for you history and physical you may have been given the following medications: An antibiotic: Start this medication when you get home and take according to the instructions on the bottle. Zofran 4 mg:  This is to treat nausea and vomiting.  You can take this every 6 hours as needed and only if needed. Valium 2 mg for breast cancer patients: This is for muscle tightness if you have an implant or expander. This will help relax your muscle which also helps with pain control.  This can be taken every 12 hours as needed. Don't drive after taking this medication. Norco (hydrocodone/acetaminophen) 5/325 mg:  This is only to be used after you have taken the Motrin or the Tylenol. Every 8 hours as needed.   Over the counter Medication to take: Ibuprofen (Motrin) 600 mg:  Take this every 6 hours.  If you have additional pain then take 500 mg of the Tylenol every 8 hours.  Only take the Norco after you have tried these two. MiraLAX or Milk of Magnesia: Take  this according to the bottle if you take the Norco.  WHEN TO CALL Call your surgeon's office if any of the following occur: Fever 101 degrees F or greater Excessive bleeding or fluid from the incision site. Pain that increases over time without aid from the medications Redness, warmth, or pus draining from incision sites Persistent nausea or inability to take in liquids Severe misshapen area that underwent the operation.

## 2023-06-02 NOTE — Anesthesia Procedure Notes (Signed)
Procedure Name: LMA Insertion Date/Time: 06/02/2023 11:36 AM  Performed by: Ronnette Hila, CRNAPre-anesthesia Checklist: Patient identified, Emergency Drugs available, Suction available and Patient being monitored Patient Re-evaluated:Patient Re-evaluated prior to induction Oxygen Delivery Method: Circle system utilized Preoxygenation: Pre-oxygenation with 100% oxygen Induction Type: IV induction Ventilation: Mask ventilation without difficulty LMA: LMA inserted LMA Size: 4.0 Number of attempts: 1 Airway Equipment and Method: Bite block Placement Confirmation: positive ETCO2 Tube secured with: Tape Dental Injury: Teeth and Oropharynx as per pre-operative assessment

## 2023-06-02 NOTE — Anesthesia Preprocedure Evaluation (Signed)
Anesthesia Evaluation  Patient identified by MRN, date of birth, ID band Patient awake    Reviewed: Allergy & Precautions, H&P , NPO status , Patient's Chart, lab work & pertinent test results  History of Anesthesia Complications (+) PONV and history of anesthetic complications  Airway Mallampati: II  TM Distance: >3 FB Neck ROM: Full    Dental no notable dental hx.    Pulmonary asthma    Pulmonary exam normal breath sounds clear to auscultation       Cardiovascular negative cardio ROS Normal cardiovascular exam Rhythm:Regular Rate:Normal     Neuro/Psych   Anxiety Depression    negative neurological ROS  negative psych ROS   GI/Hepatic negative GI ROS, Neg liver ROS,,,  Endo/Other    Morbid obesity  Renal/GU negative Renal ROS  negative genitourinary   Musculoskeletal negative musculoskeletal ROS (+)    Abdominal  (+) + obese  Peds negative pediatric ROS (+)  Hematology negative hematology ROS (+)   Anesthesia Other Findings   Reproductive/Obstetrics negative OB ROS                             Anesthesia Physical Anesthesia Plan  ASA: 3  Anesthesia Plan: General   Post-op Pain Management: Dilaudid IV   Induction: Intravenous  PONV Risk Score and Plan: 4 or greater and Ondansetron, Dexamethasone, Midazolam, Droperidol and Treatment may vary due to age or medical condition  Airway Management Planned: LMA  Additional Equipment:   Intra-op Plan:   Post-operative Plan: Extubation in OR  Informed Consent: I have reviewed the patients History and Physical, chart, labs and discussed the procedure including the risks, benefits and alternatives for the proposed anesthesia with the patient or authorized representative who has indicated his/her understanding and acceptance.     Dental advisory given  Plan Discussed with: CRNA  Anesthesia Plan Comments:        Anesthesia  Quick Evaluation

## 2023-06-02 NOTE — Interval H&P Note (Signed)
History and Physical Interval Note:  06/02/2023 10:52 AM  Tracey Cervantes  has presented today for surgery, with the diagnosis of Macromastia.  The various methods of treatment have been discussed with the patient and family. After consideration of risks, benefits and other options for treatment, the patient has consented to  Procedure(s): BREAST REDUCTION WITH LIPOSUCTION (Bilateral) as a surgical intervention.  The patient's history has been reviewed, patient examined, no change in status, stable for surgery.  I have reviewed the patient's chart and labs.  Questions were answered to the patient's satisfaction.     Alena Bills Laquasha Groome

## 2023-06-02 NOTE — Anesthesia Postprocedure Evaluation (Signed)
Anesthesia Post Note  Patient: Tracey Cervantes  Procedure(s) Performed: BREAST REDUCTION WITH LIPOSUCTION (Bilateral: Breast)     Patient location during evaluation: PACU Anesthesia Type: General Level of consciousness: awake and alert Pain management: pain level controlled Vital Signs Assessment: post-procedure vital signs reviewed and stable Respiratory status: spontaneous breathing, nonlabored ventilation, respiratory function stable and patient connected to nasal cannula oxygen Cardiovascular status: blood pressure returned to baseline and stable Postop Assessment: no apparent nausea or vomiting Anesthetic complications: no  No notable events documented.  Last Vitals:  Vitals:   06/02/23 1445 06/02/23 1505  BP: 131/81 (!) 135/90  Pulse: 95 95  Resp: 18 16  Temp:  (!) 36.3 C  SpO2: 97% 96%    Last Pain:  Vitals:   06/02/23 1505  TempSrc: Temporal  PainSc: 4                  Tracey Cervantes

## 2023-06-02 NOTE — Transfer of Care (Signed)
Immediate Anesthesia Transfer of Care Note  Patient: Tracey Cervantes  Procedure(s) Performed: BREAST REDUCTION WITH LIPOSUCTION (Bilateral: Breast)  Patient Location: PACU  Anesthesia Type:General  Level of Consciousness: awake, drowsy, and patient cooperative  Airway & Oxygen Therapy: Patient Spontanous Breathing and Patient connected to face mask oxygen  Post-op Assessment: Report given to RN and Post -op Vital signs reviewed and stable  Post vital signs: Reviewed and stable  Last Vitals:  Vitals Value Taken Time  BP    Temp    Pulse    Resp    SpO2      Last Pain:  Vitals:   06/02/23 1041  TempSrc: Temporal  PainSc: 0-No pain         Complications: No notable events documented.

## 2023-06-03 ENCOUNTER — Ambulatory Visit (INDEPENDENT_AMBULATORY_CARE_PROVIDER_SITE_OTHER): Payer: Self-pay | Admitting: Physician Assistant

## 2023-06-03 ENCOUNTER — Encounter (HOSPITAL_BASED_OUTPATIENT_CLINIC_OR_DEPARTMENT_OTHER): Payer: Self-pay | Admitting: Plastic Surgery

## 2023-06-03 DIAGNOSIS — Z9889 Other specified postprocedural states: Secondary | ICD-10-CM

## 2023-06-03 NOTE — Progress Notes (Signed)
Patient is a pleasant 32 year old female s/p bilateral breast reduction performed 06/02/2023 by Dr. Ulice Bold who joins via telephone for postoperative day 1 check-in.  Reviewed operative report and approximately 1300 g was removed from each breast at time of surgery.  No drains were placed.  The patient was calling from her mother's vehicle and this provider was calling from their office.  A total of 5 minutes was spent speaking with the patient and reviewing chart.    Today, she is doing well.  She states that she does not have much discomfort.  She states that she has been managing with Tylenol and ibuprofen alone.  Her family is helping with her postoperative recovery.  She is tolerating p.o. intake without difficulty.  Voiding.  No BM yet.  Ambulatory.  Denies any other issues.  No other questions.  Follow-up in 10 days, as scheduled.  She can certainly call the clinic and come in sooner if needed.

## 2023-06-09 ENCOUNTER — Telehealth: Payer: Self-pay | Admitting: Student

## 2023-06-09 ENCOUNTER — Encounter: Payer: BC Managed Care – PPO | Admitting: Student

## 2023-06-09 NOTE — Telephone Encounter (Signed)
I called the patient. I did offer her an appointment today but she is going to closely monitor and come in on Monday.

## 2023-06-09 NOTE — Telephone Encounter (Signed)
I called the patient in regards to her MyChart message.  She states that she has a little bit of blistering near her left NAC.  She states it is nonpainful and is not red and it is not draining.  Patient also reports her white Mepilex border dressings have a little bit of drainage on them and the adhesive is starting to come off.  She denies any other concerns.  She states that her breasts are a little bit sore, but her pain is controlled with over-the-counter medications.  She denies any fevers or chills.  Recommended that patient take her Mepilex border dressings off.  I also recommended that she continue to monitor the blister.  I discussed with her that if she would like, she could put Vaseline and gauze over the blister.  Discussed with her to continue to monitor her surgical sites and if anything worsens, becomes red, starts to drain, becomes painful, or if she develops any fevers or chills, to let us know.  Patient expressed understanding.  I did offer the patient an appointment today, but she states she has an appointment on Monday and will monitor closely and wait until then.  Patient knows to call back in the meantime if she has any questions or concerns about anything.

## 2023-06-13 ENCOUNTER — Telehealth: Payer: Self-pay

## 2023-06-13 ENCOUNTER — Encounter: Payer: Self-pay | Admitting: Student

## 2023-06-13 ENCOUNTER — Ambulatory Visit (INDEPENDENT_AMBULATORY_CARE_PROVIDER_SITE_OTHER): Payer: BC Managed Care – PPO | Admitting: Student

## 2023-06-13 VITALS — BP 134/92 | HR 87

## 2023-06-13 DIAGNOSIS — N611 Abscess of the breast and nipple: Secondary | ICD-10-CM | POA: Diagnosis not present

## 2023-06-13 DIAGNOSIS — Z9889 Other specified postprocedural states: Secondary | ICD-10-CM

## 2023-06-13 NOTE — Telephone Encounter (Signed)
Faxed wound supply order including demographics, ins card and most recent ov note to [employer] on 06/13/23. Received fax success confirmation. Will forward to front desk for batch scanning once correspondence is received from [employer].

## 2023-06-13 NOTE — Progress Notes (Signed)
Patient is a 32 year old female who underwent bilateral breast reduction with Dr. Ulice Bold on 06/02/2023.  She is about a week and a half postop.  Intraoperatively, patient had 1310 g removed from the right and 1347 g removed from the left.  She presents to the clinic today for postoperative follow-up.  Patient did call the clinic on 06/09/2023 with concerns of blistering near her left NAC.  She reported that it was nonpainful and was not draining.  She also reported some of the Mepilex border dressing had some drainage on it.  Plan was for patient to remove the Mepilex border dressing.  Recommended that she monitor the blister and that she may apply a little Vaseline and gauze over the blister if she would like.  Today, patient reports she is doing well.  She states that she has noted some sloughing to the left nipple.  She reports she has been applying Vaseline and gauze over this area.  Denies any drainage.  Denies any fevers or chills.  Denies any significant drainage from her incisions.  She states that she does have a little bit of pain to the inframammary incision.  Denies any other issues or concerns.  Chaperone present on exam.  On exam, patient is sitting upright in no acute distress.  Breasts are soft and symmetric.  There is no overlying erythema.  No ecchymosis noted on exam.  No obvious fluid collections on exam.  Right NAC is viable.  Steri-Strips are in place to incisions.  For mammary Steri-Strips to the right are moist.  These were removed.  Incision appears to be overall intact with the exception of some irritation and the beginning stages of a small wound to the inferior T-zone.  Some of the skin also looks a little bit irritated surrounding the incision.  There is no tenderness to palpation.  To the left breast, the left NAC appears to have had some epidermal lysis.  The nipple appears to be maintained centrally, but the areola appears to be sloughing off.  There is no surrounding erythema.   No drainage noted.  Steri-Strips are in place.  Steri-Strips to the inframammary were removed as they appeared soiled.  There is 1/2 cm by half centimeter wound noted to the inferior to Szo.  There is no drainage noted.  Remainder of the incision appears to be intact.  No signs of infection on exam.  Discussed with the patient that it appears that some of her left nipple may have died superficially.  Discussed with her that this may affect nipple/areola pigment.  Discussed with her at this time, we will have her apply Xeroform daily.  Patient expressed understanding.    Discussed with patient would like her to apply Vaseline to her inframammary incisions daily.  Discussed with her to keep these areas clean and dry and only apply a small film of Vaseline daily to the area.  Patient expressed understanding.  Discussed with patient that she should wear compression at all times and avoid vigorous activities.  Will have the patient follow back up next week.  Patient to call back in the meantime if she has any questions or concerns about anything.  Pictures were obtained of the patient and placed in the chart with the patient's or guardian's permission.

## 2023-06-15 ENCOUNTER — Ambulatory Visit: Payer: BC Managed Care – PPO | Admitting: Nurse Practitioner

## 2023-06-21 ENCOUNTER — Telehealth: Payer: Self-pay

## 2023-06-21 DIAGNOSIS — F331 Major depressive disorder, recurrent, moderate: Secondary | ICD-10-CM | POA: Diagnosis not present

## 2023-06-21 DIAGNOSIS — F4310 Post-traumatic stress disorder, unspecified: Secondary | ICD-10-CM | POA: Diagnosis not present

## 2023-06-21 DIAGNOSIS — F411 Generalized anxiety disorder: Secondary | ICD-10-CM | POA: Diagnosis not present

## 2023-06-21 DIAGNOSIS — F900 Attention-deficit hyperactivity disorder, predominantly inattentive type: Secondary | ICD-10-CM | POA: Diagnosis not present

## 2023-06-21 NOTE — Telephone Encounter (Signed)
Pt left voicemail this morning with concerns about a place on one of her breast excisions. I called pt back, na, left voicemail. Pt has post op appt with Dr. Ulice Bold this Friday, 8/30. Adv pt to send a MyChart message or if she called after hours, the on call provider would be able to answer her questions.

## 2023-06-22 NOTE — Telephone Encounter (Signed)
Spoke to patient. She just wanted to check the wound she has. She states that it is about the same as last week. No pain, fever, chills, redness, warmth. Informed to keep using the Xeroform and to call if she gets any of the above symptoms or heavy drainage. Pt states she felt she could wait until her appointment with Dr. Ulice Bold on 8/30.

## 2023-06-24 ENCOUNTER — Ambulatory Visit (INDEPENDENT_AMBULATORY_CARE_PROVIDER_SITE_OTHER): Payer: BC Managed Care – PPO | Admitting: Plastic Surgery

## 2023-06-24 ENCOUNTER — Encounter: Payer: Self-pay | Admitting: Plastic Surgery

## 2023-06-24 VITALS — BP 146/98 | HR 92 | Ht 63.0 in | Wt 235.0 lb

## 2023-06-24 DIAGNOSIS — N62 Hypertrophy of breast: Secondary | ICD-10-CM

## 2023-06-24 NOTE — Progress Notes (Signed)
The patient is a 32 year old female here for bilateral breast reduction from 8/8.  She had over 1300 g removed from both breasts.  She has a little bit of sloughing of the left areola and a little opening of the vertical limb on the left.  But nothing looks infected.  She needs to continue with Xeroform dressing changes.  We might need to do a little revision at the inframammary fold but this will likely heal quite nicely.  I do want to see her back in a few weeks if she has any questions or concerns she should let us know.

## 2023-07-11 ENCOUNTER — Encounter: Payer: Self-pay | Admitting: Student

## 2023-07-11 ENCOUNTER — Ambulatory Visit (INDEPENDENT_AMBULATORY_CARE_PROVIDER_SITE_OTHER): Payer: BC Managed Care – PPO | Admitting: Student

## 2023-07-11 VITALS — BP 152/99 | HR 86 | Ht 63.0 in | Wt 235.0 lb

## 2023-07-11 DIAGNOSIS — Z9889 Other specified postprocedural states: Secondary | ICD-10-CM

## 2023-07-11 NOTE — Progress Notes (Signed)
Patient is a 32 year old female who underwent bilateral breast reduction with Dr. Ulice Bold on 06/02/2023.  She is almost 6 weeks postop.  Patient presents to the clinic today for postoperative follow-up.  Patient was seen in the clinic on 06/13/2023.  At this visit, patient was doing well.  On exam, right NAC was viable.  Incision was overall intact with the exception of some irritation in the beginning stages of a small wound to the right inferior T-zone.  To the left breast, the left NAC appear to have some epidermal lysis.  Nipple appear to be maintained centrally, but areola appear to be sloughing off.  There were no signs of infection on the exam.  Plan is for patient to apply Xeroform daily to her left NAC.  Also plan was for patient to continue Vaseline to her inframammary incisions daily.  Patient was then seen again on 06/24/2023.  At this visit, patient was noted to have a little bit of sloughing at the left areola and a little opening of the left vertical limb.  Nothing looked infected.  Plan is for patient to continue Xeroform dressing changes.  Today, patient reports she is doing well.  She denies any new issues or concerns with her breasts.  She states she has been applying Xeroform to her left NAC without any issue.  Denies any fevers, chills or drainage.  Chaperone present on exam.  On exam, patient is sitting upright in no acute distress.  Breasts are soft and symmetric.  No overlying erythema in either breast.  No obvious fluid collections palpated on exam.  There is a little bit of firmness to the lateral aspect of the right breast, consistent with some scar tissue.  No overlying skin changes to this area.  Right NAC appears to be viable.  There is a small wound noted to the inferior/medial aspect.  No surrounding erythema, no drainage.  Right breast incisions are otherwise intact and healing well.  To the left breast, left NAC has a superficial wound noted to the superior/lateral aspect of  it.  No surrounding erythema or drainage.  There is some hypopigmentation noted to the NAC.  There is a small 1 cm x 1 cm wound noted to the inferior T-zone of the left breast.  No drainage noted on exam.  No signs of infection on exam.  Incision to the left breast is otherwise intact and healing well.  Discussed with patient would like her to apply Vaseline daily over her right NAC wound, her left NAC wound, and the left inferior T-zone wound.  Discussed with patient she may cover with gauze if she would like.  Patient expressed understanding.  Discussed with patient to continue compression and avoid heavy activities for now, but discussed with patient that next week she may transition from a compression bra to a regular bra without underwire.  Also discussed that she may start gradually increasing her activities next week, but I advised her to avoid any heavy activities.  Patient expressed understanding.  Will have the patient follow back up in about 3 weeks for reevaluation of her wounds.  I instructed the patient to call in the meantime she has any questions or concerns about anything.  Pictures were obtained of the patient and placed in the chart with the patient's or guardian's permission.

## 2023-08-01 ENCOUNTER — Ambulatory Visit (INDEPENDENT_AMBULATORY_CARE_PROVIDER_SITE_OTHER): Payer: BC Managed Care – PPO | Admitting: Student

## 2023-08-01 ENCOUNTER — Encounter: Payer: Self-pay | Admitting: Student

## 2023-08-01 VITALS — BP 156/105 | HR 94

## 2023-08-01 DIAGNOSIS — Z9889 Other specified postprocedural states: Secondary | ICD-10-CM

## 2023-08-01 NOTE — Progress Notes (Signed)
Patient is a 32 year old female who underwent bilateral breast reduction with Dr. Ulice Bold on 06/02/2023.  She is about 2 months postop.  She presents to the clinic today for further follow-up.  Patient was last seen in the clinic on 07/11/2023.  At this visit, patient was doing well.  She reported she had been applying Xeroform to her left NAC without any issues.  On exam, her breasts are soft and symmetric.  No overlying erythema, no fluid collections little bit of firmness to the lateral aspect of the right breast.  Right NAC appear to be viable.  To the left breast, there was a left NAC superficial wound.  Plan was for patient to continue with Vaseline and follow-up in 3 weeks.  Today, patient reports she is doing well.  She states that almost all of her wounds have completely healed.  Denies any other issues or concerns.  Denies any fevers or chills.  Chaperone present on exam.  On exam, patient is sitting upright in no acute distress.  Breasts are soft and symmetric bilaterally.  There is no overlying erythema.  No fluid collections palpated on exam.  Right NAC appears to be healthy.  Left NAC appears to be healing well, there is a small 0.5 x 0.5 wound to the lateral/superior aspect of the areola and a little bit of hypopigmentation adjacent to it.  Pigmentation to the left NAC does appear to be improved from previous exam.  Incisions are all intact and well-healed.  Wound to the inferior left T-zone appears to be improved.  No signs of infection on exam.  There is a little bit of firmness underneath the incision to the inframammary incision of the right breast.  This appears to be consistent with some scar tissue.    Discussed with patient she can continue Vaseline to the wound to her left areola.  Discussed otherwise she may start using scar cream such as Mederma, silicone strips or silicone based scar cream to her incisions throughout except the wound.  Discussed with patient she may start applying  scar creams to the area once the wound has completely healed.  Patient expressed understanding.  Discussed with patient she can start gradually increasing her activities interested in into a regular bra without underwire.  Patient expressed understanding.  Discussed with patient that if she has any difficulties with her wound healing or does not like the hypopigmentation to her left areola, she can follow back up with [place].  Otherwise patient can follow-up as needed.  Instructed patient to call us if she has any questions or concerns about anything.  Pictures were obtained of the patient and placed in the chart with the patient's or guardian's permission.

## 2023-08-04 DIAGNOSIS — F331 Major depressive disorder, recurrent, moderate: Secondary | ICD-10-CM | POA: Diagnosis not present

## 2023-08-04 DIAGNOSIS — F900 Attention-deficit hyperactivity disorder, predominantly inattentive type: Secondary | ICD-10-CM | POA: Diagnosis not present

## 2023-08-04 DIAGNOSIS — F4310 Post-traumatic stress disorder, unspecified: Secondary | ICD-10-CM | POA: Diagnosis not present

## 2023-08-04 DIAGNOSIS — F411 Generalized anxiety disorder: Secondary | ICD-10-CM | POA: Diagnosis not present

## 2023-08-10 ENCOUNTER — Ambulatory Visit: Payer: BC Managed Care – PPO | Admitting: Nurse Practitioner

## 2023-08-10 DIAGNOSIS — E785 Hyperlipidemia, unspecified: Secondary | ICD-10-CM | POA: Insufficient documentation

## 2023-08-10 NOTE — Progress Notes (Deleted)
   There were no vitals taken for this visit.   Subjective:    Patient ID: Tracey Cervantes, female    DOB: [birth year], 32 y.o.   MRN: [id number]  HPI: Tracey Cervantes is a 32 y.o. female  No chief complaint on file.  Elevated blood pressure:  Obesity:  Current weight : *** BMI: *** previous weight:235 lbs Treatment Tried: *** Comorbidities: asthma, anxiety, depression, prediabetes, HLD  HLD:  -Medications: none -Patient is working on lifestyle modification -Last lipid panel:  Lipid Panel     Component Value Date/Time   CHOL 179 09/30/2022 1117   TRIG 70 09/30/2022 1117   HDL 61 09/30/2022 1117   CHOLHDL 2.9 09/30/2022 1117   LDLCALC 102 (H) 09/30/2022 1117       Relevant past medical, surgical, family and social history reviewed and updated as indicated. Interim medical history since our last visit reviewed. Allergies and medications reviewed and updated.  Review of Systems  Constitutional: Negative for fever or weight change.  Respiratory: Negative for cough and shortness of breath.   Cardiovascular: Negative for chest pain or palpitations.  Gastrointestinal: Negative for abdominal pain, no bowel changes.  Musculoskeletal: Negative for gait problem or joint swelling.  Skin: Negative for rash.  Neurological: Negative for dizziness or headache.  No other specific complaints in a complete review of systems (except as listed in HPI above).      Objective:    There were no vitals taken for this visit.  Wt Readings from Last 3 Encounters:  07/11/23 235 lb (106.6 kg)  06/24/23 235 lb (106.6 kg)  06/02/23 239 lb 13.8 oz (108.8 kg)    Physical Exam  Constitutional: Patient appears well-developed and well-nourished. Obese *** No distress.  HEENT: head atraumatic, normocephalic, pupils equal and reactive to light, ears ***, neck supple, throat within normal limits Cardiovascular: Normal rate, regular rhythm and normal heart sounds.  No murmur heard. No BLE  edema. Pulmonary/Chest: Effort normal and breath sounds normal. No respiratory distress. Abdominal: Soft.  There is no tenderness. Psychiatric: Patient has a normal mood and affect. behavior is normal. Judgment and thought content normal.      Assessment & Plan:   Problem List Items Addressed This Visit   None    Follow up plan: No follow-ups on file.

## 2023-08-15 ENCOUNTER — Ambulatory Visit: Payer: BC Managed Care – PPO | Admitting: Nurse Practitioner

## 2023-08-17 DIAGNOSIS — Z1151 Encounter for screening for human papillomavirus (HPV): Secondary | ICD-10-CM | POA: Diagnosis not present

## 2023-08-17 DIAGNOSIS — Z01419 Encounter for gynecological examination (general) (routine) without abnormal findings: Secondary | ICD-10-CM | POA: Diagnosis not present

## 2023-08-17 DIAGNOSIS — Z6841 Body Mass Index (BMI) 40.0 and over, adult: Secondary | ICD-10-CM | POA: Diagnosis not present

## 2023-08-17 DIAGNOSIS — Z124 Encounter for screening for malignant neoplasm of cervix: Secondary | ICD-10-CM | POA: Diagnosis not present

## 2023-08-17 LAB — HM PAP SMEAR: HM Pap smear: NORMAL

## 2023-09-01 DIAGNOSIS — F900 Attention-deficit hyperactivity disorder, predominantly inattentive type: Secondary | ICD-10-CM | POA: Diagnosis not present

## 2023-09-01 DIAGNOSIS — F411 Generalized anxiety disorder: Secondary | ICD-10-CM | POA: Diagnosis not present

## 2023-09-01 DIAGNOSIS — F4310 Post-traumatic stress disorder, unspecified: Secondary | ICD-10-CM | POA: Diagnosis not present

## 2023-09-01 DIAGNOSIS — F331 Major depressive disorder, recurrent, moderate: Secondary | ICD-10-CM | POA: Diagnosis not present

## 2023-09-16 DIAGNOSIS — F900 Attention-deficit hyperactivity disorder, predominantly inattentive type: Secondary | ICD-10-CM | POA: Diagnosis not present

## 2023-09-29 DIAGNOSIS — F900 Attention-deficit hyperactivity disorder, predominantly inattentive type: Secondary | ICD-10-CM | POA: Diagnosis not present

## 2023-09-29 DIAGNOSIS — F331 Major depressive disorder, recurrent, moderate: Secondary | ICD-10-CM | POA: Diagnosis not present

## 2023-09-29 DIAGNOSIS — F4310 Post-traumatic stress disorder, unspecified: Secondary | ICD-10-CM | POA: Diagnosis not present

## 2023-09-29 DIAGNOSIS — F411 Generalized anxiety disorder: Secondary | ICD-10-CM | POA: Diagnosis not present

## 2023-09-30 DIAGNOSIS — F900 Attention-deficit hyperactivity disorder, predominantly inattentive type: Secondary | ICD-10-CM | POA: Diagnosis not present

## 2023-10-05 ENCOUNTER — Encounter: Payer: Self-pay | Admitting: Nurse Practitioner

## 2023-10-05 ENCOUNTER — Ambulatory Visit: Payer: BC Managed Care – PPO | Admitting: Nurse Practitioner

## 2023-10-05 VITALS — BP 140/82 | HR 98 | Temp 97.9°F | Resp 18 | Ht 63.0 in | Wt 242.5 lb

## 2023-10-05 DIAGNOSIS — R7303 Prediabetes: Secondary | ICD-10-CM

## 2023-10-05 DIAGNOSIS — E66813 Obesity, class 3: Secondary | ICD-10-CM

## 2023-10-05 DIAGNOSIS — J4520 Mild intermittent asthma, uncomplicated: Secondary | ICD-10-CM | POA: Diagnosis not present

## 2023-10-05 DIAGNOSIS — F330 Major depressive disorder, recurrent, mild: Secondary | ICD-10-CM | POA: Diagnosis not present

## 2023-10-05 DIAGNOSIS — F411 Generalized anxiety disorder: Secondary | ICD-10-CM

## 2023-10-05 DIAGNOSIS — Z23 Encounter for immunization: Secondary | ICD-10-CM

## 2023-10-05 DIAGNOSIS — Z6841 Body Mass Index (BMI) 40.0 and over, adult: Secondary | ICD-10-CM

## 2023-10-05 DIAGNOSIS — Z130 Encounter for screening for diseases of the blood and blood-forming organs and certain disorders involving the immune mechanism: Secondary | ICD-10-CM

## 2023-10-05 DIAGNOSIS — E782 Mixed hyperlipidemia: Secondary | ICD-10-CM

## 2023-10-05 DIAGNOSIS — I10 Essential (primary) hypertension: Secondary | ICD-10-CM | POA: Insufficient documentation

## 2023-10-05 DIAGNOSIS — F902 Attention-deficit hyperactivity disorder, combined type: Secondary | ICD-10-CM

## 2023-10-05 MED ORDER — ZEPBOUND 5 MG/0.5ML ~~LOC~~ SOAJ
5.0000 mg | SUBCUTANEOUS | 0 refills | Status: DC
Start: 2023-10-05 — End: 2023-10-06

## 2023-10-05 MED ORDER — AMLODIPINE BESYLATE 5 MG PO TABS
5.0000 mg | ORAL_TABLET | Freq: Every day | ORAL | 0 refills | Status: DC
Start: 2023-10-05 — End: 2023-10-06

## 2023-10-05 NOTE — Assessment & Plan Note (Signed)
Comorbidities include HTN, HLD, prediabetes

## 2023-10-05 NOTE — Progress Notes (Signed)
BP (!) 140/82   Pulse 98   Temp 97.9 F (36.6 C) (Oral)   Resp 18   Ht 5\' 3"  (1.6 m) Comment: per patient  Wt 242 lb 8 oz (110 kg)   LMP 09/12/2023 (Approximate)   SpO2 100%   BMI 42.96 kg/m    Subjective:    Patient ID: Tracey Cervantes, female    DOB: 09/19/1991, 32 y.o.   MRN: [id number]  HPI: Tracey Cervantes is a 32 y.o. female  Chief Complaint  Patient presents with   Medical Management of Chronic Issues    Discussed the use of AI scribe software for clinical note transcription with the patient, who gave verbal consent to proceed.  History of Present Illness   The patient, with a history of asthma, depression, anxiety, obesity, hyperlipidemia, prediabetes, and ADHD, presents for a follow-up visit. The patient recently underwent a breast reduction surgery in August and reports minimal complications and a good recovery. The patient's back pain has improved since the surgery. The patient's asthma has been well-controlled, but she is currently experiencing symptoms of allergic rhinitis due to weather changes and travel. The patient uses a Publishing rights manager inhaler and Flonase to manage these symptoms and also takes over-the-counter allergy medication as needed.  The patient's blood pressure has been consistently high in recent months, with readings ranging from 134/94 to 156/105. The patient's psychiatrist and the patient have been trying to determine if any of the patient's current medications could be causing the elevated blood pressure. The patient has a family history of hypertension, with both parents on blood pressure medication.  The patient is also considering starting a weight loss medication, Zepbound, which has been approved by her insurance. The patient has concerns about potential weight gain after stopping the medication and is seeking more information about this. The patient is also considering other options for obtaining the medication if her insurance stops covering it.        10/05/2023    2:52 PM 01/13/2023    9:37 AM 09/30/2022   10:30 AM  Depression screen PHQ 2/9  Decreased Interest 1 2 1   Down, Depressed, Hopeless 1 1 1   PHQ - 2 Score 2 3 2   Altered sleeping 3 3 3   Tired, decreased energy 2 3 1   Change in appetite 2 1 2   Feeling bad or failure about yourself  1 2 1   Trouble concentrating 2 3 1   Moving slowly or fidgety/restless 1 2 0  Suicidal thoughts 0 0 0  PHQ-9 Score 13 17 10   Difficult doing work/chores Somewhat difficult Very difficult Somewhat difficult    Relevant past medical, surgical, family and social history reviewed and updated as indicated. Interim medical history since our last visit reviewed. Allergies and medications reviewed and updated.  Review of Systems Constitutional: Negative for fever or weight change.  Respiratory: Negative for cough and shortness of breath.   Cardiovascular: Negative for chest pain or palpitations.  Gastrointestinal: Negative for abdominal pain, no bowel changes.  Musculoskeletal: Negative for gait problem or joint swelling.  Skin: Negative for rash.  Neurological: Negative for dizziness or headache.  No other specific complaints in a complete review of systems (except as listed in HPI above).      Objective:    BP (!) 140/82   Pulse 98   Temp 97.9 F (36.6 C) (Oral)   Resp 18   Ht 5\' 3"  (1.6 m) Comment: per patient  Wt 242 lb 8 oz (110 kg)  LMP 09/12/2023 (Approximate)   SpO2 100%   BMI 42.96 kg/m   BP Readings from Last 3 Encounters:  10/05/23 (!) 140/82  08/01/23 (!) 156/105  07/11/23 (!) 152/99     Wt Readings from Last 3 Encounters:  10/05/23 242 lb 8 oz (110 kg)  07/11/23 235 lb (106.6 kg)  06/24/23 235 lb (106.6 kg)    Physical Exam  Constitutional: Patient appears well-developed and well-nourished. Obese  No distress.  HEENT: head atraumatic, normocephalic, pupils equal and reactive to light, neck supple, throat within normal limits Cardiovascular: Normal rate,  regular rhythm and normal heart sounds.  No murmur heard. No BLE edema. Pulmonary/Chest: Effort normal and breath sounds normal. No respiratory distress. Abdominal: Soft.  There is no tenderness. Psychiatric: Patient has a normal mood and affect. behavior is normal. Judgment and thought content normal.  Results for orders placed or performed during the hospital encounter of 06/02/23  Pregnancy, urine POC  Result Value Ref Range   Preg Test, Ur NEGATIVE NEGATIVE  Surgical pathology  Result Value Ref Range   SURGICAL PATHOLOGY      SURGICAL PATHOLOGY CASE: [id number] PATIENT: Tracey Cervantes Surgical Pathology Report     Clinical History: Macromastia (nt)     FINAL MICROSCOPIC DIAGNOSIS:  A. BREAST, RIGHT, REDUCTION: - Benign breast tissue with no significant pathologic changes  B. BREAST, LEFT, REDUCTION: - Benign breast tissue with no significant pathologic changes    GROSS DESCRIPTION:  A: Specimen: Right breast tissue and liposuction contents, received fresh Weight: 1310 g Size in aggregate: 20 x 18 x 6.5 cm including attached and separate portions of skin. Cut Surface: The cut surfaces consist of soft yellow adipose tissue and white fibrous tissue.  There are no discrete masses. Block Summary: 2 blocks submitted  B: Specimen: Left breast tissue and liposuction contents, received fresh Weight: 1347 g Size in aggregate: 20 x 19 x 6.5 cm including attached and separate portions of skin. Cut Surface: The cut surfaces consist of soft yellow adipose tissue and white fibrous  tissue.  There are no discrete masses. Block Summary: 2 blocks submitted (GRP 06/03/2023)    Final Diagnosis performed by Clifton James, MD.   Electronically signed 06/06/2023 Technical component performed at [facility]. [facility], [address], [place], [place] [address].  Professional component performed at [facility], [address], [place], [place]  [address].  Immunohistochemistry Technical component (if applicable) was performed at [facility]. [address], [place], [place] [address].   IMMUNOHISTOCHEMISTRY DISCLAIMER (if applicable): Some of these immunohistochemical stains may have been developed and the performance characteristics determine by [facility]. Some may not have been cleared or approved by the U.S. Food and Drug Administration. The FDA has determined that such clearance or approval is not necessary. This test is used for clinical purposes. It should not be regarded as Therapist, sports or for research. This laboratory is certified under the Clinical Laboratory Improvement Amendments of 1988 (CLIA-88) as qualified to perform high complexity clinical laboratory testing.  The controls stained appropriately.   IHC stains are performed on formalin fixed, paraffin embedded tissue using a 3,3"diaminobenzidine (DAB) chromogen and Leica Bond Autostainer System. The staining intensity of the nucleus is score manually and is reported as the percentage of tumor cell nuclei demonstrating specific nuclear staining. The specimens are fixed in 10% Neutral Formalin for at least 6 hours and up to 72hrs. These tests are validated on decalcified tissue. Results should  be interpreted with caution given the possibility of false negative results on decalcified specimens. Antibody Clones are as follows ER-clone 57F, PR-clone 16, Ki67- clone MM1. Some of these immunohistochemical stains may have been developed and the performance characteristics determined by [facility].    Last lipids Lab Results  Component Value Date   CHOL 179 09/30/2022   HDL 61 09/30/2022   LDLCALC 102 (H) 09/30/2022   TRIG 70 09/30/2022   CHOLHDL 2.9 09/30/2022   Last hemoglobin A1c Lab Results  Component Value Date   HGBA1C 6.2 (H) 09/30/2022        Assessment & Plan:   Problem List Items Addressed This  Visit       Cardiovascular and Mediastinum   Primary hypertension   Relevant Medications   amLODipine (NORVASC) 5 MG tablet     Respiratory   Asthma     Other   Mild episode of recurrent major depressive disorder (HCC)   GAD (generalized anxiety disorder)   Attention deficit hyperactivity disorder (ADHD), combined type   Prediabetes   Relevant Orders   COMPLETE METABOLIC PANEL WITH GFR   Hemoglobin A1c   Hyperlipidemia   Relevant Medications   amLODipine (NORVASC) 5 MG tablet   Other Relevant Orders   Lipid panel   Class 3 severe obesity due to excess calories with serious comorbidity and body mass index (BMI) of 40.0 to 44.9 in adult (HCC) - Primary    Comorbidities include HTN, HLD, prediabetes      Relevant Medications   tirzepatide (ZEPBOUND) 5 MG/0.5ML Pen   Other Visit Diagnoses     Immunization due       Relevant Orders   Tdap vaccine greater than or equal to 7yo IM (Completed)   Screening for deficiency anemia       Relevant Orders   CBC with Differential/Platelet        Assessment and Plan    Hypertension Newly diagnosed based on consistently elevated blood pressure readings over several months. Discussed the risks associated with untreated hypertension and the importance of consistent medication use. -Start Amlodipine 5mg  daily. -Check blood pressure at home and report readings. -Follow-up in 4 weeks to assess blood pressure control.  Obesity Patient has been approved for Zepbound but has not started it due to concerns about long-term use and potential weight regain after discontinuation. Discussed the role of lifestyle modifications in conjunction with medication use for weight loss. -Start Zepbound 2.5mg , to be increased to 5mg  after 4 weeks. -Follow-up in 4 weeks to assess tolerance and effectiveness.  Asthma/Allergic Rhinitis Stable with use of Primatene Mist inhaler and Flonase. No recent exacerbations of asthma. Current complaints of sinus  symptoms due to weather changes. -Continue current regimen. -Consider over-the-counter allergy medication as needed for sinus symptoms.  Depression/Anxiety/ADHD Managed by psychiatry. Currently on Vyvanse 40mg  daily, Effexor 150mg  daily, and Trazodone 50mg  at bedtime. No changes to psychiatric medications discussed. -Continue current regimen. -Continue follow-up with psychiatry as scheduled.  Prediabetes Last A1c was 6.2. No changes to management discussed. -Continue current lifestyle modifications. -Continue monitoring A1c as per usual care.  Hyperlipidemia No changes to management discussed. -Continue current lifestyle modifications. -Continue monitoring lipid profile as per usual care.  General Health Maintenance -Administer tetanus vaccine today.        Follow up plan: Return in about 4 weeks (around 11/02/2023) for follow up.

## 2023-10-06 ENCOUNTER — Other Ambulatory Visit: Payer: Self-pay | Admitting: Nurse Practitioner

## 2023-10-06 DIAGNOSIS — I10 Essential (primary) hypertension: Secondary | ICD-10-CM

## 2023-10-06 DIAGNOSIS — E1165 Type 2 diabetes mellitus with hyperglycemia: Secondary | ICD-10-CM

## 2023-10-06 LAB — LIPID PANEL
Cholesterol: 188 mg/dL (ref <200)
HDL: 52 mg/dL (ref >=50)
LDL Cholesterol (Calc): 117 mg/dL — ABNORMAL HIGH
Non-HDL Cholesterol (Calc): 136 mg/dL — ABNORMAL HIGH (ref <130)
Total CHOL/HDL Ratio: 3.6 (calc) (ref <5.0)
Triglycerides: 86 mg/dL (ref <150)

## 2023-10-06 LAB — CBC WITH DIFFERENTIAL/PLATELET
Absolute Lymphocytes: 2640 {cells}/uL (ref 850–3900)
Absolute Monocytes: 504 {cells}/uL (ref 200–950)
Basophils Absolute: 40 {cells}/uL (ref 0–200)
Basophils Relative: 0.5 %
Eosinophils Absolute: 168 {cells}/uL (ref 15–500)
Eosinophils Relative: 2.1 %
HCT: 41.4 % (ref 35.0–45.0)
Hemoglobin: 13.6 g/dL (ref 11.7–15.5)
MCH: 28.1 pg (ref 27.0–33.0)
MCHC: 32.9 g/dL (ref 32.0–36.0)
MCV: 85.5 fL (ref 80.0–100.0)
MPV: 9.4 fL (ref 7.5–12.5)
Monocytes Relative: 6.3 %
Neutro Abs: 4648 {cells}/uL (ref 1500–7800)
Neutrophils Relative %: 58.1 %
Platelets: 344 10*3/uL (ref 140–400)
RBC: 4.84 10*6/uL (ref 3.80–5.10)
RDW: 13.1 % (ref 11.0–15.0)
Total Lymphocyte: 33 %
WBC: 8 10*3/uL (ref 3.8–10.8)

## 2023-10-06 LAB — COMPLETE METABOLIC PANEL WITH GFR
AG Ratio: 1.2 (calc) (ref 1.0–2.5)
ALT: 18 U/L (ref 6–29)
AST: 13 U/L (ref 10–30)
Albumin: 4 g/dL (ref 3.6–5.1)
Alkaline phosphatase (APISO): 75 U/L (ref 31–125)
BUN/Creatinine Ratio: 11 (calc) (ref 6–22)
BUN: 11 mg/dL (ref 7–25)
CO2: 29 mmol/L (ref 20–32)
Calcium: 9.7 mg/dL (ref 8.6–10.2)
Chloride: 104 mmol/L (ref 98–110)
Creat: 1.02 mg/dL — ABNORMAL HIGH (ref 0.50–0.97)
Globulin: 3.3 g/dL (ref 1.9–3.7)
Glucose, Bld: 103 mg/dL — ABNORMAL HIGH (ref 65–99)
Potassium: 4.7 mmol/L (ref 3.5–5.3)
Sodium: 139 mmol/L (ref 135–146)
Total Bilirubin: 0.2 mg/dL (ref 0.2–1.2)
Total Protein: 7.3 g/dL (ref 6.1–8.1)
eGFR: 75 mL/min/{1.73_m2} (ref >=60)

## 2023-10-06 LAB — HEMOGLOBIN A1C
Hgb A1c MFr Bld: 6.5 %{Hb} — ABNORMAL HIGH (ref <5.7)
Mean Plasma Glucose: 140 mg/dL
eAG (mmol/L): 7.7 mmol/L

## 2023-10-06 MED ORDER — TIRZEPATIDE 5 MG/0.5ML ~~LOC~~ SOAJ
5.0000 mg | SUBCUTANEOUS | 0 refills | Status: DC
Start: 2023-10-06 — End: 2023-11-21

## 2023-10-06 MED ORDER — HYDROCHLOROTHIAZIDE 12.5 MG PO TABS: 12.5000 mg | ORAL_TABLET | Freq: Every day | ORAL | 0 refills | Status: DC

## 2023-10-06 MED ORDER — BLOOD GLUCOSE TEST VI STRP
1.0000 | ORAL_STRIP | Freq: Three times a day (TID) | 0 refills | Status: AC
Start: 2023-10-06 — End: 2023-11-05

## 2023-10-06 MED ORDER — LANCETS MISC. MISC
1.0000 | Freq: Three times a day (TID) | 0 refills | Status: AC
Start: 2023-10-06 — End: 2023-11-05

## 2023-10-06 MED ORDER — LANCET DEVICE MISC
1.0000 | Freq: Three times a day (TID) | 0 refills | Status: AC
Start: 2023-10-06 — End: 2023-11-05

## 2023-10-06 MED ORDER — BLOOD GLUCOSE MONITORING SUPPL DEVI
1.0000 | Freq: Three times a day (TID) | 0 refills | Status: AC
Start: 2023-10-06 — End: ?

## 2023-10-06 NOTE — Telephone Encounter (Unsigned)
Copied from CRM [id number]. Topic: General - Other >> Oct 06, 2023  2:49 PM Phill Myron wrote: Gearldine Bienenstock with [employer], need office notes stating that patient Bazen is a type 2 diabetic... for authorization of the medication mounjaro  Fax# [phone]

## 2023-10-06 NOTE — Telephone Encounter (Signed)
Completed with confirmation.

## 2023-10-07 DIAGNOSIS — F900 Attention-deficit hyperactivity disorder, predominantly inattentive type: Secondary | ICD-10-CM | POA: Diagnosis not present

## 2023-10-12 DIAGNOSIS — F900 Attention-deficit hyperactivity disorder, predominantly inattentive type: Secondary | ICD-10-CM | POA: Diagnosis not present

## 2023-11-02 ENCOUNTER — Ambulatory Visit: Payer: BC Managed Care – PPO | Admitting: Nurse Practitioner

## 2023-11-02 ENCOUNTER — Encounter: Payer: Self-pay | Admitting: Nurse Practitioner

## 2023-11-02 VITALS — BP 126/84 | HR 91 | Temp 98.0°F | Resp 18 | Ht 63.0 in | Wt 234.9 lb

## 2023-11-02 DIAGNOSIS — I10 Essential (primary) hypertension: Secondary | ICD-10-CM | POA: Diagnosis not present

## 2023-11-02 DIAGNOSIS — F411 Generalized anxiety disorder: Secondary | ICD-10-CM | POA: Diagnosis not present

## 2023-11-02 DIAGNOSIS — J0140 Acute pansinusitis, unspecified: Secondary | ICD-10-CM

## 2023-11-02 DIAGNOSIS — E1165 Type 2 diabetes mellitus with hyperglycemia: Secondary | ICD-10-CM | POA: Diagnosis not present

## 2023-11-02 DIAGNOSIS — T3695XA Adverse effect of unspecified systemic antibiotic, initial encounter: Secondary | ICD-10-CM

## 2023-11-02 DIAGNOSIS — B379 Candidiasis, unspecified: Secondary | ICD-10-CM

## 2023-11-02 DIAGNOSIS — F330 Major depressive disorder, recurrent, mild: Secondary | ICD-10-CM

## 2023-11-02 DIAGNOSIS — Z6841 Body Mass Index (BMI) 40.0 and over, adult: Secondary | ICD-10-CM

## 2023-11-02 DIAGNOSIS — M7061 Trochanteric bursitis, right hip: Secondary | ICD-10-CM

## 2023-11-02 DIAGNOSIS — Z7985 Long-term (current) use of injectable non-insulin antidiabetic drugs: Secondary | ICD-10-CM

## 2023-11-02 MED ORDER — HYDROCHLOROTHIAZIDE 12.5 MG PO TABS
12.5000 mg | ORAL_TABLET | Freq: Every day | ORAL | 1 refills | Status: DC
Start: 2023-11-02 — End: 2024-03-02

## 2023-11-02 MED ORDER — FLUCONAZOLE 150 MG PO TABS: 150.0000 mg | ORAL_TABLET | ORAL | 0 refills | Status: DC | PRN

## 2023-11-02 MED ORDER — CELECOXIB 100 MG PO CAPS
100.0000 mg | ORAL_CAPSULE | Freq: Two times a day (BID) | ORAL | 0 refills | Status: DC
Start: 2023-11-02 — End: 2024-03-02

## 2023-11-02 MED ORDER — AMOXICILLIN-POT CLAVULANATE 875-125 MG PO TABS
1.0000 | ORAL_TABLET | Freq: Two times a day (BID) | ORAL | 0 refills | Status: DC
Start: 2023-11-02 — End: 2024-03-02

## 2023-11-02 NOTE — Progress Notes (Signed)
 BP 126/84   Pulse 91   Temp 98 F (36.7 C)   Resp 18   Ht 5' 3 (1.6 m)   Wt 234 lb 14.4 oz (106.5 kg)   LMP 10/12/2023   SpO2 99%   BMI 41.61 kg/m    Subjective:    Patient ID: Tracey Cervantes, female    DOB: 1991/09/27, 33 y.o.   MRN: [id number]  HPI: Tracey Cervantes is a 33 y.o. female  Chief Complaint  Patient presents with   Medical Management of Chronic Issues    Discussed the use of AI scribe software for clinical note transcription with the patient, who gave verbal consent to proceed.  History of Present Illness   The patient, recently diagnosed with type two diabetes, hypertension, obesity, depression, and anxiety, presents for a four-week follow-up. She reports constipation since starting Mounjaro , which she tolerates well otherwise. She has been increasing fiber intake and fluid consumption to manage this. Her mood has been stable on Effexor, which she has been taking for over a year. She reports a slightly elevated blood pressure reading at home during the Christmas week but has not noticed any adverse effects from the Hydrochlorothiazide . She has been checking her blood sugar sporadically, with readings ranging from 94 to 107.  The patient also reports congestion for the past week and a half, which she initially managed with Zyrtec and Advil D. However, she notes that these medications have become less effective over the past few days. She has been using a nasal spray to manage symptoms at night.  Additionally, the patient reports a pulling sensation in her right hip, which she noticed a few months ago. The pain radiates from the hip to the upper thigh and is described as sharp. The pain is not severe enough to prevent exercise but is noticeable during activity. she says pain is worsened with activity.       11/02/2023    3:05 PM 10/05/2023    2:52 PM 01/13/2023    9:37 AM  Depression screen PHQ 2/9  Decreased Interest 1 1 2   Down, Depressed, Hopeless 1 1 1   PHQ - 2  Score 2 2 3   Altered sleeping 1 3 3   Tired, decreased energy 1 2 3   Change in appetite 1 2 1   Feeling bad or failure about yourself  1 1 2   Trouble concentrating 1 2 3   Moving slowly or fidgety/restless 1 1 2   Suicidal thoughts 0 0 0  PHQ-9 Score 8 13 17   Difficult doing work/chores Somewhat difficult Somewhat difficult Very difficult       11/02/2023    3:05 PM 10/05/2023    2:54 PM 01/13/2023    9:41 AM 09/30/2022   10:31 AM  GAD 7 : Generalized Anxiety Score  Nervous, Anxious, on Edge 2 2 2 2   Control/stop worrying 3 3 2 2   Worry too much - different things 3 3 2 1   Trouble relaxing 2 2 3 3   Restless 2 2 2 2   Easily annoyed or irritable 2 2 3 1   Afraid - awful might happen 2 2 1 2   Total GAD 7 Score 16 16 15 13   Anxiety Difficulty Very difficult Very difficult Somewhat difficult Somewhat difficult     Relevant past medical, surgical, family and social history reviewed and updated as indicated. Interim medical history since our last visit reviewed. Allergies and medications reviewed and updated.  Review of Systems  Constitutional: Negative for fever or weight change.  Respiratory: Negative for cough and shortness of breath.   Cardiovascular: Negative for chest pain or palpitations.  Gastrointestinal: Negative for abdominal pain, no bowel changes.  Musculoskeletal: Negative for gait problem or joint swelling.  Skin: Negative for rash.  Neurological: Negative for dizziness or headache.  No other specific complaints in a complete review of systems (except as listed in HPI above).      Objective:    BP 126/84   Pulse 91   Temp 98 F (36.7 C)   Resp 18   Ht 5' 3 (1.6 m)   Wt 234 lb 14.4 oz (106.5 kg)   LMP 10/12/2023   SpO2 99%   BMI 41.61 kg/m   BP Readings from Last 3 Encounters:  11/02/23 126/84  10/05/23 (!) 140/82  08/01/23 (!) 156/105     Wt Readings from Last 3 Encounters:  11/02/23 234 lb 14.4 oz (106.5 kg)  10/05/23 242 lb 8 oz (110 kg)  07/11/23  235 lb (106.6 kg)    Physical Exam  Constitutional: Patient appears well-developed and well-nourished. Obese  No distress.  HEENT: head atraumatic, normocephalic, pupils equal and reactive to light, ears TMs clear, neck supple, throat within normal limits Cardiovascular: Normal rate, regular rhythm and normal heart sounds.  No murmur heard. No BLE edema. Pulmonary/Chest: Effort normal and breath sounds normal. No respiratory distress. Abdominal: Soft.  There is no tenderness. Psychiatric: Patient has a normal mood and affect. behavior is normal. Judgment and thought content normal.  Results for orders placed or performed in visit on 10/05/23  HM PAP SMEAR   Collection Time: 08/17/23 12:00 AM  Result Value Ref Range   HM Pap smear Normal    Last metabolic panel Lab Results  Component Value Date   GLUCOSE 103 (H) 10/05/2023   NA 139 10/05/2023   K 4.7 10/05/2023   CL 104 10/05/2023   CO2 29 10/05/2023   BUN 11 10/05/2023   CREATININE 1.02 (H) 10/05/2023   EGFR 75 10/05/2023   CALCIUM 9.7 10/05/2023   PROT 7.3 10/05/2023   BILITOT 0.2 10/05/2023   AST 13 10/05/2023   ALT 18 10/05/2023   ANIONGAP 7 11/29/2021   Last hemoglobin A1c Lab Results  Component Value Date   HGBA1C 6.5 (H) 10/05/2023        Assessment & Plan:   Problem List Items Addressed This Visit       Cardiovascular and Mediastinum   Primary hypertension - Primary   Relevant Medications   hydrochlorothiazide  (HYDRODIURIL ) 12.5 MG tablet     Endocrine   Type 2 diabetes mellitus with hyperglycemia, without long-term current use of insulin  (HCC)     Other   Mild episode of recurrent major depressive disorder (HCC)   GAD (generalized anxiety disorder)   Class 3 severe obesity due to excess calories with serious comorbidity and body mass index (BMI) of 40.0 to 44.9 in adult [facility])   Other Visit Diagnoses       Acute non-recurrent pansinusitis       Relevant Medications   amoxicillin -clavulanate  (AUGMENTIN ) 875-125 MG tablet   fluconazole  (DIFLUCAN ) 150 MG tablet     Antibiotic-induced yeast infection       Relevant Medications   fluconazole  (DIFLUCAN ) 150 MG tablet     Trochanteric bursitis of right hip       Relevant Medications   celecoxib  (CELEBREX ) 100 MG capsule        Assessment and Plan    Type 2 Diabetes A1c  6.5 on 10/05/2023. Tolerating Mounjaro  5mg  with constipation as a side effect. -Continue Mounjaro  5mg . -Advise to increase fiber and fluid intake. -Consider Dulcolax for constipation.  Hypertension Blood pressure well controlled on Hydrochlorothiazide  12.5mg  daily. Single elevated reading at home during the holiday season. -Continue Hydrochlorothiazide  12.5mg  daily. -Check blood pressure at home periodically.  Depression and Anxiety Stable on Effexor 150mg  daily and Trazodone 50mg  at bedtime. -Continue Effexor 150mg  daily and Trazodone 50mg  at bedtime.  Sinusitis Symptoms of congestion, postnasal drip, and headache for 1.5 weeks. Tried Zyrtec and Advil D with some relief. -Prescribe Augmentin  and Diflucan  (in case of yeast infection from antibiotics). -Continue Zyrtec, Advil D, and nasal spray. -Advise to increase fluid intake.  bursitis Right hip pain with pulling sensation and sharp pain radiating upwards. No limitation in exercise. -Prescribe Celebrex  twice daily with food for 10 days. -Advise hip exercises and stretching.  General Health Maintenance / Followup Plans -Check A1c in 4 months. -Follow up in 4 months.        Follow up plan: Return in about 4 months (around 03/01/2024) for follow up.

## 2023-11-03 DIAGNOSIS — F900 Attention-deficit hyperactivity disorder, predominantly inattentive type: Secondary | ICD-10-CM | POA: Diagnosis not present

## 2023-11-10 DIAGNOSIS — F900 Attention-deficit hyperactivity disorder, predominantly inattentive type: Secondary | ICD-10-CM | POA: Diagnosis not present

## 2023-11-11 DIAGNOSIS — F900 Attention-deficit hyperactivity disorder, predominantly inattentive type: Secondary | ICD-10-CM | POA: Diagnosis not present

## 2023-11-11 DIAGNOSIS — F411 Generalized anxiety disorder: Secondary | ICD-10-CM | POA: Diagnosis not present

## 2023-11-11 DIAGNOSIS — F331 Major depressive disorder, recurrent, moderate: Secondary | ICD-10-CM | POA: Diagnosis not present

## 2023-11-11 DIAGNOSIS — F4310 Post-traumatic stress disorder, unspecified: Secondary | ICD-10-CM | POA: Diagnosis not present

## 2023-11-18 ENCOUNTER — Other Ambulatory Visit: Payer: Self-pay | Admitting: Nurse Practitioner

## 2023-11-18 DIAGNOSIS — F900 Attention-deficit hyperactivity disorder, predominantly inattentive type: Secondary | ICD-10-CM | POA: Diagnosis not present

## 2023-11-18 DIAGNOSIS — E1165 Type 2 diabetes mellitus with hyperglycemia: Secondary | ICD-10-CM

## 2023-11-21 ENCOUNTER — Other Ambulatory Visit: Payer: Self-pay | Admitting: Nurse Practitioner

## 2023-11-21 DIAGNOSIS — E1165 Type 2 diabetes mellitus with hyperglycemia: Secondary | ICD-10-CM

## 2023-11-21 MED ORDER — TIRZEPATIDE 7.5 MG/0.5ML ~~LOC~~ SOAJ: 7.5000 mg | SUBCUTANEOUS | 0 refills | Status: DC

## 2023-11-21 NOTE — Telephone Encounter (Signed)
Requested medication (s) are due for refill today:   Yes  Requested medication (s) are on the active medication list:   Yes  Future visit scheduled:   Yes   Last ordered: 10/05/2024 2 ml, 0 refills  No protocol assigned to this medication   Requested Prescriptions  Pending Prescriptions Disp Refills   MOUNJARO 5 MG/0.5ML Pen [Pharmacy Med Name: MOUNJARO 5 MG/0.5 ML PEN[$R^]] 2 mL 0    Sig: INJECT THE CONTENTS OF ONE PEN UNDER THE SKIN WEEKLY ON THE SAME DAY EACH WEEK     Off-Protocol Failed - 11/21/2023 12:44 PM      Failed - Medication not assigned to a protocol, review manually.      Passed - Valid encounter within last 12 months    Recent Outpatient Visits           2 weeks ago Primary hypertension    [facility] Della Goo F, FNP   1 month ago Class 3 severe obesity due to excess calories with serious comorbidity and body mass index (BMI) of 40.0 to 44.9 in adult [facility])   [facility] Berniece Salines, FNP   10 months ago Annual physical exam   [facility] Berniece Salines, FNP   1 year ago Mild intermittent asthma without complication   [facility] Berniece Salines, FNP       Future Appointments             In 3 months Zane Herald, Rudolpho Sevin, FNP [facility], [facility]

## 2023-12-02 DIAGNOSIS — F900 Attention-deficit hyperactivity disorder, predominantly inattentive type: Secondary | ICD-10-CM | POA: Diagnosis not present

## 2023-12-09 DIAGNOSIS — F900 Attention-deficit hyperactivity disorder, predominantly inattentive type: Secondary | ICD-10-CM | POA: Diagnosis not present

## 2023-12-16 DIAGNOSIS — F900 Attention-deficit hyperactivity disorder, predominantly inattentive type: Secondary | ICD-10-CM | POA: Diagnosis not present

## 2023-12-21 DIAGNOSIS — F4310 Post-traumatic stress disorder, unspecified: Secondary | ICD-10-CM | POA: Diagnosis not present

## 2023-12-21 DIAGNOSIS — F331 Major depressive disorder, recurrent, moderate: Secondary | ICD-10-CM | POA: Diagnosis not present

## 2023-12-21 DIAGNOSIS — F900 Attention-deficit hyperactivity disorder, predominantly inattentive type: Secondary | ICD-10-CM | POA: Diagnosis not present

## 2023-12-21 DIAGNOSIS — G4700 Insomnia, unspecified: Secondary | ICD-10-CM | POA: Diagnosis not present

## 2023-12-23 ENCOUNTER — Other Ambulatory Visit: Payer: Self-pay | Admitting: Nurse Practitioner

## 2023-12-23 DIAGNOSIS — F900 Attention-deficit hyperactivity disorder, predominantly inattentive type: Secondary | ICD-10-CM | POA: Diagnosis not present

## 2023-12-23 DIAGNOSIS — E1165 Type 2 diabetes mellitus with hyperglycemia: Secondary | ICD-10-CM

## 2023-12-23 NOTE — Telephone Encounter (Signed)
 Requested medication (s) are due for refill today: Yes  Requested medication (s) are on the active medication list: Yes  Last refill:  11/21/23  Future visit scheduled: Yes  Notes to clinic:  Unable to refill due to no refill protocol for this medication.      Requested Prescriptions  Pending Prescriptions Disp Refills   MOUNJARO 7.5 MG/0.5ML Pen [Pharmacy Med Name: MOUNJARO 7.5 MG/0.5 ML PEN[$R^]] 2 mL 0    Sig: INJECT THE CONTENTS OF ONE PEN UNDER THE SKIN WEEKLY ON THE SAME DAY EACH WEEK     Off-Protocol Failed - 12/23/2023  3:37 PM      Failed - Medication not assigned to a protocol, review manually.      Passed - Valid encounter within last 12 months    Recent Outpatient Visits           1 month ago Primary hypertension   [facility] Della Goo F, FNP   2 months ago Class 3 severe obesity due to excess calories with serious comorbidity and body mass index (BMI) of 40.0 to 44.9 in adult [facility])   [facility] Berniece Salines, FNP   11 months ago Annual physical exam   [facility] Berniece Salines, FNP   1 year ago Mild intermittent asthma without complication   [facility] Berniece Salines, FNP       Future Appointments             In 2 months Zane Herald, Rudolpho Sevin, FNP [facility], [facility]

## 2023-12-26 ENCOUNTER — Other Ambulatory Visit: Payer: Self-pay | Admitting: Nurse Practitioner

## 2023-12-26 DIAGNOSIS — E1165 Type 2 diabetes mellitus with hyperglycemia: Secondary | ICD-10-CM

## 2023-12-26 MED ORDER — TIRZEPATIDE 10 MG/0.5ML ~~LOC~~ SOAJ
10.0000 mg | SUBCUTANEOUS | 0 refills | Status: DC
Start: 2023-12-26 — End: 2024-01-19

## 2024-01-17 DIAGNOSIS — F331 Major depressive disorder, recurrent, moderate: Secondary | ICD-10-CM | POA: Diagnosis not present

## 2024-01-17 DIAGNOSIS — F411 Generalized anxiety disorder: Secondary | ICD-10-CM | POA: Diagnosis not present

## 2024-01-17 DIAGNOSIS — F900 Attention-deficit hyperactivity disorder, predominantly inattentive type: Secondary | ICD-10-CM | POA: Diagnosis not present

## 2024-01-17 DIAGNOSIS — F4310 Post-traumatic stress disorder, unspecified: Secondary | ICD-10-CM | POA: Diagnosis not present

## 2024-01-18 ENCOUNTER — Telehealth: Payer: Self-pay | Admitting: Nurse Practitioner

## 2024-01-18 NOTE — Telephone Encounter (Signed)
 Prior auth started through [employer] member not found

## 2024-01-18 NOTE — Telephone Encounter (Signed)
 Prior auth from cover my meds  Zepbound 2.5  Key: [facility]

## 2024-01-18 NOTE — Telephone Encounter (Signed)
 Left detailed vm w/ patient will need new ins card updated

## 2024-01-19 ENCOUNTER — Other Ambulatory Visit: Payer: Self-pay | Admitting: Nurse Practitioner

## 2024-01-19 DIAGNOSIS — E1165 Type 2 diabetes mellitus with hyperglycemia: Secondary | ICD-10-CM

## 2024-01-19 NOTE — Telephone Encounter (Signed)
 Requested medication (s) are due for refill today: Yes  Requested medication (s) are on the active medication list: Yes  Last refill:  12/26/23  Future visit scheduled: Yes  Notes to clinic:  Unable to refill due to no refill protocol for this medication.      Requested Prescriptions  Pending Prescriptions Disp Refills   MOUNJARO 10 MG/0.5ML Pen [Pharmacy Med Name: MOUNJARO 10 MG/0.5 ML PEN[$R^]] 2 mL 0    Sig: INJECT THE CONTENTS OF ONE PEN UNDER THE SKIN WEEKLY ON THE SAME DAY EACH WEEK     Off-Protocol Failed - 01/19/2024  1:19 PM      Failed - Medication not assigned to a protocol, review manually.      Failed - Valid encounter within last 12 months    Recent Outpatient Visits   None     Future Appointments             In 1 month Pender, Rudolpho Sevin, FNP [facility], [facility]

## 2024-01-25 DIAGNOSIS — F900 Attention-deficit hyperactivity disorder, predominantly inattentive type: Secondary | ICD-10-CM | POA: Diagnosis not present

## 2024-02-03 DIAGNOSIS — F900 Attention-deficit hyperactivity disorder, predominantly inattentive type: Secondary | ICD-10-CM | POA: Diagnosis not present

## 2024-02-08 DIAGNOSIS — F900 Attention-deficit hyperactivity disorder, predominantly inattentive type: Secondary | ICD-10-CM | POA: Diagnosis not present

## 2024-02-15 DIAGNOSIS — F900 Attention-deficit hyperactivity disorder, predominantly inattentive type: Secondary | ICD-10-CM | POA: Diagnosis not present

## 2024-02-17 ENCOUNTER — Other Ambulatory Visit: Payer: Self-pay | Admitting: Nurse Practitioner

## 2024-02-17 DIAGNOSIS — E1165 Type 2 diabetes mellitus with hyperglycemia: Secondary | ICD-10-CM

## 2024-02-17 NOTE — Telephone Encounter (Signed)
 Requested medication (s) are due for refill today: Yes  Requested medication (s) are on the active medication list: Yes  Last refill:  01/19/24  Future visit scheduled: Yes  Notes to clinic:  Unable to refill due to no refill protocol for this medication.      Requested Prescriptions  Pending Prescriptions Disp Refills   MOUNJARO 10 MG/0.5ML Pen [Pharmacy Med Name: MOUNJARO 10 MG/0.5 ML PEN[$R^]] 2 mL 0    Sig: INJECT THE CONTENTS OF ONE PEN UNDER THE SKIN WEEKLY ON THE SAME DAY EACH WEEK     Off-Protocol Failed - 02/17/2024  2:32 PM      Failed - Medication not assigned to a protocol, review manually.      Failed - Valid encounter within last 12 months    Recent Outpatient Visits   None     Future Appointments             In 2 weeks Abram Hoguet, Monalisa Angles, FNP [facility], [facility]

## 2024-02-23 DIAGNOSIS — F900 Attention-deficit hyperactivity disorder, predominantly inattentive type: Secondary | ICD-10-CM | POA: Diagnosis not present

## 2024-02-29 DIAGNOSIS — F4310 Post-traumatic stress disorder, unspecified: Secondary | ICD-10-CM | POA: Diagnosis not present

## 2024-02-29 DIAGNOSIS — F331 Major depressive disorder, recurrent, moderate: Secondary | ICD-10-CM | POA: Diagnosis not present

## 2024-02-29 DIAGNOSIS — F900 Attention-deficit hyperactivity disorder, predominantly inattentive type: Secondary | ICD-10-CM | POA: Diagnosis not present

## 2024-02-29 DIAGNOSIS — F411 Generalized anxiety disorder: Secondary | ICD-10-CM | POA: Diagnosis not present

## 2024-03-02 ENCOUNTER — Ambulatory Visit: Payer: Self-pay | Admitting: Nurse Practitioner

## 2024-03-02 ENCOUNTER — Encounter: Payer: Self-pay | Admitting: Nurse Practitioner

## 2024-03-02 VITALS — BP 118/82 | HR 100 | Temp 98.2°F | Resp 18 | Ht 63.0 in | Wt 204.5 lb

## 2024-03-02 DIAGNOSIS — J301 Allergic rhinitis due to pollen: Secondary | ICD-10-CM

## 2024-03-02 DIAGNOSIS — Z6841 Body Mass Index (BMI) 40.0 and over, adult: Secondary | ICD-10-CM

## 2024-03-02 DIAGNOSIS — Z23 Encounter for immunization: Secondary | ICD-10-CM

## 2024-03-02 DIAGNOSIS — J4520 Mild intermittent asthma, uncomplicated: Secondary | ICD-10-CM | POA: Diagnosis not present

## 2024-03-02 DIAGNOSIS — I10 Essential (primary) hypertension: Secondary | ICD-10-CM | POA: Diagnosis not present

## 2024-03-02 DIAGNOSIS — E1165 Type 2 diabetes mellitus with hyperglycemia: Secondary | ICD-10-CM

## 2024-03-02 DIAGNOSIS — F330 Major depressive disorder, recurrent, mild: Secondary | ICD-10-CM

## 2024-03-02 DIAGNOSIS — E66813 Obesity, class 3: Secondary | ICD-10-CM

## 2024-03-02 DIAGNOSIS — F411 Generalized anxiety disorder: Secondary | ICD-10-CM

## 2024-03-02 DIAGNOSIS — F902 Attention-deficit hyperactivity disorder, combined type: Secondary | ICD-10-CM

## 2024-03-02 LAB — POCT GLYCOSYLATED HEMOGLOBIN (HGB A1C): Hemoglobin A1C: 5.5 % (ref 4.0–5.6)

## 2024-03-02 MED ORDER — HYDROCHLOROTHIAZIDE 12.5 MG PO TABS
12.5000 mg | ORAL_TABLET | Freq: Every day | ORAL | 1 refills | Status: DC
Start: 2024-03-02 — End: 2024-07-26

## 2024-03-02 NOTE — Assessment & Plan Note (Signed)
 Managed by psychiatry, continue effecor 150 mg daily, trazodone 25 mg as needed at bedtime and vyvanse 50 mg daily

## 2024-03-02 NOTE — Assessment & Plan Note (Signed)
 Continue mounjaro.

## 2024-03-02 NOTE — Assessment & Plan Note (Signed)
No changes, stable.

## 2024-03-02 NOTE — Assessment & Plan Note (Signed)
Continue hydrochlorothiazide 12.5 mg daily.

## 2024-03-02 NOTE — Assessment & Plan Note (Signed)
 Continue flonase.  May add on zyrtec as needed.

## 2024-03-02 NOTE — Progress Notes (Signed)
 BP 118/82   Pulse 100   Temp 98.2 F (36.8 C)   Resp 18   Ht 5\' 3"  (1.6 m)   Wt 204 lb 8 oz (92.8 kg)   LMP 02/23/2024   SpO2 97%   BMI 36.23 kg/m    Subjective:    Patient ID: Tracey Cervantes, female    DOB: 1991/03/11, 33 y.o.   MRN: [id number]  HPI: Tracey Cervantes is a 32 y.o. female  Chief Complaint  Patient presents with   Medical Management of Chronic Issues   Diabetes    Diabetes, Type 2:  -Last A1c 6.5, today 5.5 -Medications: mounjaro 10 mg weekly -Patient is compliant with the above medications and reports no side effects.  -Diet: well balanced diet -Exercise: recommend 150 min of physical activity weekly   -Eye exam: done this year request records -Foot exam: today -Microalbumin: today -Statin: no -PNA vaccine: today -Denies symptoms of hypoglycemia, polyuria, polydipsia, numbness extremities, foot ulcers/trauma.    Hypertension:  -Medications: hydrochlorothiazide  12.5 mg daily -Patient is compliant with above medications and reports no side effects. -Denies any SOB, CP, vision changes, LE edema or symptoms of hypotension    03/02/2024    9:32 AM 11/02/2023    2:44 PM 10/05/2023    3:37 PM  Vitals with BMI  Height 5\' 3"  5\' 3"    Weight 204 lbs 8 oz 234 lbs 14 oz   BMI 36.23 41.62   Systolic 118 126 409  Diastolic 82 84 82  Pulse 100 91     Obesity:  Current weight : 204 lbs BMI: 36.23 previous weight:234 lbs Treatment Tried: mounjaro Comorbidities: DM, HTN   Adhd/anxiety/depression: Managed by psychiatry, continue effecor 150 mg daily, trazodone 25 mg as needed at bedtime and vyvanse 50 mg daily     03/02/2024    9:34 AM 11/02/2023    3:05 PM 10/05/2023    2:52 PM  Depression screen PHQ 2/9  Decreased Interest 0 1 1  Down, Depressed, Hopeless 1 1 1   PHQ - 2 Score 1 2 2   Altered sleeping 1 1 3   Tired, decreased energy 1 1 2   Change in appetite 0 1 2  Feeling bad or failure about yourself  0 1 1  Trouble concentrating 0 1 2  Moving slowly  or fidgety/restless 0 1 1  Suicidal thoughts 0 0 0  PHQ-9 Score 3 8 13   Difficult doing work/chores Not difficult at all Somewhat difficult Somewhat difficult       03/02/2024    9:34 AM 11/02/2023    3:05 PM 10/05/2023    2:54 PM 01/13/2023    9:41 AM  GAD 7 : Generalized Anxiety Score  Nervous, Anxious, on Edge 1 2 2 2   Control/stop worrying 0 3 3 2   Worry too much - different things 0 3 3 2   Trouble relaxing 0 2 2 3   Restless 0 2 2 2   Easily annoyed or irritable 0 2 2 3   Afraid - awful might happen 0 2 2 1   Total GAD 7 Score 1 16 16 15   Anxiety Difficulty Not difficult at all Very difficult Very difficult Somewhat difficult     Relevant past medical, surgical, family and social history reviewed and updated as indicated. Interim medical history since our last visit reviewed. Allergies and medications reviewed and updated.  Review of Systems  Constitutional: Negative for fever or weight change.  Respiratory: Negative for cough and shortness of breath.   Cardiovascular: Negative for  chest pain or palpitations.  Gastrointestinal: Negative for abdominal pain, no bowel changes.  Musculoskeletal: Negative for gait problem or joint swelling.  Skin: Negative for rash.  Neurological: Negative for dizziness or headache.  No other specific complaints in a complete review of systems (except as listed in HPI above).      Objective:      BP 118/82   Pulse 100   Temp 98.2 F (36.8 C)   Resp 18   Ht 5\' 3"  (1.6 m)   Wt 204 lb 8 oz (92.8 kg)   LMP 02/23/2024   SpO2 97%   BMI 36.23 kg/m    Wt Readings from Last 3 Encounters:  03/02/24 204 lb 8 oz (92.8 kg)  11/02/23 234 lb 14.4 oz (106.5 kg)  10/05/23 242 lb 8 oz (110 kg)    Physical Exam Vitals reviewed.  Constitutional:      Appearance: Normal appearance.  HENT:     Head: Normocephalic.  Cardiovascular:     Rate and Rhythm: Normal rate and regular rhythm.  Pulmonary:     Effort: Pulmonary effort is normal.     Breath  sounds: Normal breath sounds.  Musculoskeletal:        General: Normal range of motion.  Skin:    General: Skin is warm and dry.  Neurological:     General: No focal deficit present.     Mental Status: She is alert and oriented to person, place, and time. Mental status is at baseline.  Psychiatric:        Mood and Affect: Mood normal.        Behavior: Behavior normal.        Thought Content: Thought content normal.        Judgment: Judgment normal.        Results for orders placed or performed in visit on 03/02/24  POCT HgB A1C   Collection Time: 03/02/24  9:35 AM  Result Value Ref Range   Hemoglobin A1C 5.5 4.0 - 5.6 %   HbA1c POC (<> result, manual entry)     HbA1c, POC (prediabetic range)     HbA1c, POC (controlled diabetic range)            Assessment & Plan:   Problem List Items Addressed This Visit       Cardiovascular and Mediastinum   Primary hypertension - Primary   Continue hydrochlorothiazide  12.5 mg daily      Relevant Medications   hydrochlorothiazide  (HYDRODIURIL ) 12.5 MG tablet     Respiratory   Allergic rhinitis due to pollen   Continue flonase.  May add on zyrtec as needed.       Asthma   No changes, stable        Endocrine   Type 2 diabetes mellitus with hyperglycemia, without long-term current use of insulin  (HCC)   Continue mounjaro      Relevant Orders   POCT HgB A1C (Completed)   Microalbumin / creatinine urine ratio   HM Diabetes Foot Exam (Completed)     Other   Mild episode of recurrent major depressive disorder (HCC)   Managed by psychiatry, continue effecor 150 mg daily, trazodone 25 mg as needed at bedtime and vyvanse 50 mg daily      GAD (generalized anxiety disorder)   Managed by psychiatry, continue effecor 150 mg daily, trazodone 25 mg as needed at bedtime and vyvanse 50 mg daily      Attention deficit hyperactivity disorder (ADHD), combined type  Managed by psychiatry, continue effecor 150 mg daily, trazodone  25 mg as needed at bedtime and vyvanse 50 mg daily      Class 3 severe obesity due to excess calories with serious comorbidity and body mass index (BMI) of 40.0 to 44.9 in adult   Continue mounjaro      Other Visit Diagnoses       Immunization due       Relevant Orders   Pneumococcal conjugate vaccine 20-valent (Prevnar 20) (Completed)           Follow up plan: Return in about 6 months (around 09/02/2024) for follow up.

## 2024-03-03 LAB — MICROALBUMIN / CREATININE URINE RATIO
Creatinine, Urine: 127 mg/dL (ref 20–275)
Microalb Creat Ratio: 2 mg/g{creat} (ref <30)
Microalb, Ur: 0.3 mg/dL

## 2024-03-07 DIAGNOSIS — F900 Attention-deficit hyperactivity disorder, predominantly inattentive type: Secondary | ICD-10-CM | POA: Diagnosis not present

## 2024-03-17 ENCOUNTER — Other Ambulatory Visit: Payer: Self-pay | Admitting: Nurse Practitioner

## 2024-03-20 ENCOUNTER — Encounter: Payer: Self-pay | Admitting: Nurse Practitioner

## 2024-03-21 ENCOUNTER — Other Ambulatory Visit: Payer: Self-pay | Admitting: Nurse Practitioner

## 2024-03-21 DIAGNOSIS — E1165 Type 2 diabetes mellitus with hyperglycemia: Secondary | ICD-10-CM

## 2024-03-21 MED ORDER — TIRZEPATIDE 12.5 MG/0.5ML ~~LOC~~ SOAJ: 12.5000 mg | SUBCUTANEOUS | 0 refills | Status: DC

## 2024-03-21 NOTE — Telephone Encounter (Signed)
 Requested Prescriptions  Pending Prescriptions Disp Refills   TURQOZ  0.3-30 MG-MCG tablet [Pharmacy Med Name: TURQOZ -28 TAB[!]] 28 tablet 11    Sig: TAKE ONE TABLET BY MOUTH ONE TIME DAILY     OB/GYN:  Contraceptives Passed - 03/21/2024 11:21 AM      Passed - Last BP in normal range    BP Readings from Last 1 Encounters:  03/02/24 118/82         Passed - Valid encounter within last 12 months    Recent Outpatient Visits           2 weeks ago Primary hypertension   [facility] [place], [place]              Passed - Patient is not a smoker

## 2024-03-22 DIAGNOSIS — F900 Attention-deficit hyperactivity disorder, predominantly inattentive type: Secondary | ICD-10-CM | POA: Diagnosis not present

## 2024-04-05 DIAGNOSIS — F900 Attention-deficit hyperactivity disorder, predominantly inattentive type: Secondary | ICD-10-CM | POA: Diagnosis not present

## 2024-04-10 DIAGNOSIS — F900 Attention-deficit hyperactivity disorder, predominantly inattentive type: Secondary | ICD-10-CM | POA: Diagnosis not present

## 2024-04-10 DIAGNOSIS — F411 Generalized anxiety disorder: Secondary | ICD-10-CM | POA: Diagnosis not present

## 2024-04-10 DIAGNOSIS — F4310 Post-traumatic stress disorder, unspecified: Secondary | ICD-10-CM | POA: Diagnosis not present

## 2024-04-10 DIAGNOSIS — F331 Major depressive disorder, recurrent, moderate: Secondary | ICD-10-CM | POA: Diagnosis not present

## 2024-04-18 ENCOUNTER — Other Ambulatory Visit: Payer: Self-pay | Admitting: Nurse Practitioner

## 2024-04-18 DIAGNOSIS — E1165 Type 2 diabetes mellitus with hyperglycemia: Secondary | ICD-10-CM

## 2024-04-19 DIAGNOSIS — F900 Attention-deficit hyperactivity disorder, predominantly inattentive type: Secondary | ICD-10-CM | POA: Diagnosis not present

## 2024-04-19 NOTE — Telephone Encounter (Signed)
 Requested Prescriptions  Pending Prescriptions Disp Refills   tirzepatide  (MOUNJARO ) 12.5 MG/0.5ML Pen [Pharmacy Med Name: MOUNJARO  12.5 MG/0.5 ML PEN[$R^]] 6 mL 0    Sig: INJECT THE CONTENTS OF ONE PEN UNDER THE SKIN WEEKLY ON THE SAME DAY EACH WEEK     Off-Protocol Failed - 04/19/2024  1:58 PM      Failed - Medication not assigned to a protocol, review manually.      Passed - Valid encounter within last 12 months    Recent Outpatient Visits           1 month ago Primary hypertension   [facility] Gareth Mliss [place], [place]

## 2024-05-15 DIAGNOSIS — F900 Attention-deficit hyperactivity disorder, predominantly inattentive type: Secondary | ICD-10-CM | POA: Diagnosis not present

## 2024-05-22 DIAGNOSIS — F331 Major depressive disorder, recurrent, moderate: Secondary | ICD-10-CM | POA: Diagnosis not present

## 2024-05-22 DIAGNOSIS — F411 Generalized anxiety disorder: Secondary | ICD-10-CM | POA: Diagnosis not present

## 2024-05-22 DIAGNOSIS — F900 Attention-deficit hyperactivity disorder, predominantly inattentive type: Secondary | ICD-10-CM | POA: Diagnosis not present

## 2024-05-22 DIAGNOSIS — F4310 Post-traumatic stress disorder, unspecified: Secondary | ICD-10-CM | POA: Diagnosis not present

## 2024-05-30 DIAGNOSIS — F900 Attention-deficit hyperactivity disorder, predominantly inattentive type: Secondary | ICD-10-CM | POA: Diagnosis not present

## 2024-06-05 ENCOUNTER — Encounter: Payer: Self-pay | Admitting: Nurse Practitioner

## 2024-06-14 DIAGNOSIS — F900 Attention-deficit hyperactivity disorder, predominantly inattentive type: Secondary | ICD-10-CM | POA: Diagnosis not present

## 2024-06-19 DIAGNOSIS — F331 Major depressive disorder, recurrent, moderate: Secondary | ICD-10-CM | POA: Diagnosis not present

## 2024-06-19 DIAGNOSIS — F411 Generalized anxiety disorder: Secondary | ICD-10-CM | POA: Diagnosis not present

## 2024-06-19 DIAGNOSIS — F900 Attention-deficit hyperactivity disorder, predominantly inattentive type: Secondary | ICD-10-CM | POA: Diagnosis not present

## 2024-06-19 DIAGNOSIS — F4310 Post-traumatic stress disorder, unspecified: Secondary | ICD-10-CM | POA: Diagnosis not present

## 2024-07-04 DIAGNOSIS — F900 Attention-deficit hyperactivity disorder, predominantly inattentive type: Secondary | ICD-10-CM | POA: Diagnosis not present

## 2024-07-05 ENCOUNTER — Other Ambulatory Visit: Payer: Self-pay | Admitting: Nurse Practitioner

## 2024-07-05 DIAGNOSIS — E1165 Type 2 diabetes mellitus with hyperglycemia: Secondary | ICD-10-CM

## 2024-07-06 NOTE — Telephone Encounter (Signed)
 Requested medication (s) are due for refill today:   Yes  Requested medication (s) are on the active medication list:   Yes  Future visit scheduled:   No.   LOV 03/02/2024   Last ordered: 04/19/2024 6 ml, 0 refills  No protocol assigned    Requested Prescriptions  Pending Prescriptions Disp Refills   MOUNJARO  12.5 MG/0.5ML Pen [Pharmacy Med Name: MOUNJARO  12.5 MG/0.5 ML PEN[$R^]] 6 mL 0    Sig: INJECT THE CONTENTS OF ONE PEN UNDER THE SKIN WEEKLY ON THE SAME DAY EACH WEEK     Off-Protocol Failed - 07/06/2024 11:45 AM      Failed - Medication not assigned to a protocol, review manually.      Passed - Valid encounter within last 12 months    Recent Outpatient Visits           4 months ago Primary hypertension   [facility] Gareth Mliss [place], [place]

## 2024-07-17 DIAGNOSIS — F331 Major depressive disorder, recurrent, moderate: Secondary | ICD-10-CM | POA: Diagnosis not present

## 2024-07-17 DIAGNOSIS — F411 Generalized anxiety disorder: Secondary | ICD-10-CM | POA: Diagnosis not present

## 2024-07-17 DIAGNOSIS — F900 Attention-deficit hyperactivity disorder, predominantly inattentive type: Secondary | ICD-10-CM | POA: Diagnosis not present

## 2024-07-17 DIAGNOSIS — F4310 Post-traumatic stress disorder, unspecified: Secondary | ICD-10-CM | POA: Diagnosis not present

## 2024-07-20 DIAGNOSIS — F900 Attention-deficit hyperactivity disorder, predominantly inattentive type: Secondary | ICD-10-CM | POA: Diagnosis not present

## 2024-07-25 LAB — OPHTHALMOLOGY REPORT-SCANNED

## 2024-07-26 ENCOUNTER — Ambulatory Visit: Admitting: Nurse Practitioner

## 2024-07-26 ENCOUNTER — Other Ambulatory Visit: Payer: Self-pay | Admitting: Medical Genetics

## 2024-07-26 VITALS — BP 118/72 | HR 100 | Temp 98.2°F | Resp 18 | Ht 63.0 in | Wt 173.8 lb

## 2024-07-26 DIAGNOSIS — M5136 Other intervertebral disc degeneration, lumbar region with discogenic back pain only: Secondary | ICD-10-CM

## 2024-07-26 DIAGNOSIS — M545 Low back pain, unspecified: Secondary | ICD-10-CM

## 2024-07-26 MED ORDER — PREDNISONE 10 MG (21) PO TBPK
ORAL_TABLET | ORAL | 0 refills | Status: AC
Start: 2024-07-26 — End: ?

## 2024-07-26 MED ORDER — CELECOXIB 100 MG PO CAPS
100.0000 mg | ORAL_CAPSULE | Freq: Two times a day (BID) | ORAL | 0 refills | Status: AC
Start: 2024-07-26 — End: ?

## 2024-07-26 MED ORDER — TIZANIDINE HCL 4 MG PO TABS
2.0000 mg | ORAL_TABLET | Freq: Three times a day (TID) | ORAL | 0 refills | Status: AC | PRN
Start: 2024-07-26 — End: ?

## 2024-07-26 NOTE — Progress Notes (Signed)
 BP 118/72   Pulse 100   Temp 98.2 F (36.8 C)   Resp 18   Ht 5' 3 (1.6 m)   Wt 173 lb 12.8 oz (78.8 kg)   LMP 07/12/2024   SpO2 100%   BMI 30.79 kg/m    Subjective:    Patient ID: Tracey Cervantes, female    DOB: [birth year], 33 y.o.   MRN: [id number]  HPI: Tracey Cervantes is a 33 y.o. female  Chief Complaint  Patient presents with   Back Pain    DX DDD in 2015 in [place]   Discussed the use of AI scribe software for clinical note transcription with the patient, who gave verbal consent to proceed.  History of Present Illness Tracey Cervantes is a 33 year old female with degenerative disc disease who presents with lower back pain.  Lumbar pain - Lower back pain began after a workout, initially asymptomatic during travel and a football game, but worsened by the end of the evening - Pain is primarily localized to the right lumbar region and can radiate when severe - Describes pain as similar to 'wearing shoes that pinch' with movement - Pain is exacerbated by sitting and walking - Difficulty participating in workouts due to fear of worsening symptoms - Self-care with heat and stretching provided partial relief, but pain persists - Recent use of ice, pain relievers, and lidocaine  patches has not been effective  Degenerative disc disease - Diagnosed in 2015 via MRI - Previous treatments include steroid injections and physical therapy - Last orthopedic evaluation in 2016 - Last physical therapy session in 2019          07/26/2024   11:26 AM 03/02/2024    9:34 AM 11/02/2023    3:05 PM  Depression screen PHQ 2/9  Decreased Interest 1 0 1  Down, Depressed, Hopeless 1 1 1   PHQ - 2 Score 2 1 2   Altered sleeping 3 1 1   Tired, decreased energy 2 1 1   Change in appetite 1 0 1  Feeling bad or failure about yourself  0 0 1  Trouble concentrating 1 0 1  Moving slowly or fidgety/restless 0 0 1  Suicidal thoughts 0 0 0  PHQ-9 Score 9 3 8   Difficult doing work/chores Somewhat difficult  Not difficult at all Somewhat difficult    Relevant past medical, surgical, family and social history reviewed and updated as indicated. Interim medical history since our last visit reviewed. Allergies and medications reviewed and updated.  Review of Systems  Ten systems reviewed and is negative except as mentioned in HPI      Objective:      BP 118/72   Pulse 100   Temp 98.2 F (36.8 C)   Resp 18   Ht 5' 3 (1.6 m)   Wt 173 lb 12.8 oz (78.8 kg)   LMP 07/12/2024   SpO2 100%   BMI 30.79 kg/m    Wt Readings from Last 3 Encounters:  07/26/24 173 lb 12.8 oz (78.8 kg)  03/02/24 204 lb 8 oz (92.8 kg)  11/02/23 234 lb 14.4 oz (106.5 kg)    Physical Exam GENERAL: Alert, cooperative, well developed, no acute distress HEENT: Normocephalic, normal oropharynx, moist mucous membranes CHEST: Clear to auscultation bilaterally, No wheezes, rhonchi, or crackles CARDIOVASCULAR: Normal heart rate and rhythm, S1 and S2 normal without murmurs ABDOMEN: Soft, non-tender, non-distended, without organomegaly, Normal bowel sounds EXTREMITIES: No cyanosis or edema NEUROLOGICAL: Cranial nerves grossly intact, Moves all extremities without gross motor  or sensory deficit  Results for orders placed or performed in visit on 03/02/24  POCT HgB A1C   Collection Time: 03/02/24  9:35 AM  Result Value Ref Range   Hemoglobin A1C 5.5 4.0 - 5.6 %   HbA1c POC (<> result, manual entry)     HbA1c, POC (prediabetic range)     HbA1c, POC (controlled diabetic range)    Microalbumin / creatinine urine ratio   Collection Time: 03/02/24  3:24 PM  Result Value Ref Range   Creatinine, Urine 127 20 - 275 mg/dL   Microalb, Ur 0.3 mg/dL   Microalb Creat Ratio 2 <30 mg/g creat          Assessment & Plan:   Problem List Items Addressed This Visit       Other   Back pain   Relevant Medications   predniSONE  (STERAPRED UNI-PAK 21 TAB) 10 MG (21) TBPK tablet   celecoxib  (CELEBREX ) 100 MG capsule    tiZANidine (ZANAFLEX) 4 MG tablet   Other Relevant Orders   Ambulatory referral to Orthopedic Surgery   Other Visit Diagnoses       Degeneration of intervertebral disc of lumbar region with discogenic back pain    -  Primary   Relevant Medications   predniSONE  (STERAPRED UNI-PAK 21 TAB) 10 MG (21) TBPK tablet   celecoxib  (CELEBREX ) 100 MG capsule   tiZANidine (ZANAFLEX) 4 MG tablet   Other Relevant Orders   Ambulatory referral to Orthopedic Surgery        Assessment and Plan Assessment & Plan Lumbar degenerative disc disease with chronic low back pain Chronic low back pain due to lumbar degenerative disc disease, initially diagnosed in 2015, with recent exacerbation likely due to physical activity. Pain is localized to the right side with occasional radiation. Previous treatments included steroid injections and physical therapy. Current flare-up is unresponsive to home remedies such as heat, ice, and over-the-counter analgesics. No recent orthopedic follow-up since 2019. She is concerned about the possibility of surgery but prefers to explore non-surgical options first. - Refer to [facility] for further evaluation and management. - Prescribe a steroid taper to reduce nerve inflammation. - Prescribe a muscle relaxant to be taken as needed. - Prescribe Celebrex  to be taken twice daily for ten days to manage inflammation. - Advise on the possibility of walk-in visits at [facility] if symptoms worsen.        Follow up plan: Return if symptoms worsen or fail to improve.

## 2024-07-27 DIAGNOSIS — M51362 Other intervertebral disc degeneration, lumbar region with discogenic back pain and lower extremity pain: Secondary | ICD-10-CM | POA: Diagnosis not present

## 2024-07-27 DIAGNOSIS — M5416 Radiculopathy, lumbar region: Secondary | ICD-10-CM | POA: Diagnosis not present

## 2024-07-27 DIAGNOSIS — S39012A Strain of muscle, fascia and tendon of lower back, initial encounter: Secondary | ICD-10-CM | POA: Diagnosis not present

## 2024-07-28 ENCOUNTER — Other Ambulatory Visit

## 2024-07-30 ENCOUNTER — Other Ambulatory Visit
Admission: RE | Admit: 2024-07-30 | Discharge: 2024-07-30 | Disposition: A | Discharge status: Home / Self Care | Payer: Self-pay | Source: Ambulatory Visit | Attending: Medical Genetics | Admitting: Medical Genetics

## 2024-08-01 DIAGNOSIS — F900 Attention-deficit hyperactivity disorder, predominantly inattentive type: Secondary | ICD-10-CM | POA: Diagnosis not present

## 2024-08-09 DIAGNOSIS — F900 Attention-deficit hyperactivity disorder, predominantly inattentive type: Secondary | ICD-10-CM | POA: Diagnosis not present

## 2024-08-10 LAB — GENECONNECT MOLECULAR SCREEN: Genetic Analysis Overall Interpretation: NEGATIVE

## 2024-08-13 DIAGNOSIS — M545 Low back pain, unspecified: Secondary | ICD-10-CM | POA: Diagnosis not present

## 2024-08-13 DIAGNOSIS — G8929 Other chronic pain: Secondary | ICD-10-CM | POA: Diagnosis not present

## 2024-08-17 DIAGNOSIS — F331 Major depressive disorder, recurrent, moderate: Secondary | ICD-10-CM | POA: Diagnosis not present

## 2024-08-17 DIAGNOSIS — F4310 Post-traumatic stress disorder, unspecified: Secondary | ICD-10-CM | POA: Diagnosis not present

## 2024-08-17 DIAGNOSIS — F900 Attention-deficit hyperactivity disorder, predominantly inattentive type: Secondary | ICD-10-CM | POA: Diagnosis not present

## 2024-08-17 DIAGNOSIS — G4700 Insomnia, unspecified: Secondary | ICD-10-CM | POA: Diagnosis not present

## 2024-08-31 DIAGNOSIS — F900 Attention-deficit hyperactivity disorder, predominantly inattentive type: Secondary | ICD-10-CM | POA: Diagnosis not present

## 2024-09-04 DIAGNOSIS — M545 Low back pain, unspecified: Secondary | ICD-10-CM | POA: Diagnosis not present

## 2024-09-04 DIAGNOSIS — G8929 Other chronic pain: Secondary | ICD-10-CM | POA: Diagnosis not present

## 2024-09-07 DIAGNOSIS — Z01419 Encounter for gynecological examination (general) (routine) without abnormal findings: Secondary | ICD-10-CM | POA: Diagnosis not present

## 2024-09-07 DIAGNOSIS — Z6829 Body mass index (BMI) 29.0-29.9, adult: Secondary | ICD-10-CM | POA: Diagnosis not present

## 2024-09-13 DIAGNOSIS — F4310 Post-traumatic stress disorder, unspecified: Secondary | ICD-10-CM | POA: Diagnosis not present

## 2024-09-13 DIAGNOSIS — M5416 Radiculopathy, lumbar region: Secondary | ICD-10-CM | POA: Diagnosis not present

## 2024-09-13 DIAGNOSIS — F900 Attention-deficit hyperactivity disorder, predominantly inattentive type: Secondary | ICD-10-CM | POA: Diagnosis not present

## 2024-09-13 DIAGNOSIS — M5459 Other low back pain: Secondary | ICD-10-CM | POA: Diagnosis not present

## 2024-09-13 DIAGNOSIS — M545 Low back pain, unspecified: Secondary | ICD-10-CM | POA: Diagnosis not present

## 2024-09-13 DIAGNOSIS — F331 Major depressive disorder, recurrent, moderate: Secondary | ICD-10-CM | POA: Diagnosis not present

## 2024-09-13 DIAGNOSIS — G8929 Other chronic pain: Secondary | ICD-10-CM | POA: Diagnosis not present

## 2024-09-13 DIAGNOSIS — F411 Generalized anxiety disorder: Secondary | ICD-10-CM | POA: Diagnosis not present

## 2024-09-19 ENCOUNTER — Other Ambulatory Visit: Payer: Self-pay | Admitting: Nurse Practitioner

## 2024-09-19 DIAGNOSIS — E1165 Type 2 diabetes mellitus with hyperglycemia: Secondary | ICD-10-CM

## 2024-09-24 ENCOUNTER — Telehealth: Payer: Self-pay | Admitting: Nurse Practitioner

## 2024-09-24 DIAGNOSIS — E1165 Type 2 diabetes mellitus with hyperglycemia: Secondary | ICD-10-CM

## 2024-09-24 NOTE — Telephone Encounter (Unsigned)
 Copied from CRM #[id number]. Topic: Clinical - Medication Refill >> Sep 24, 2024  5:36 PM Zebedee SAUNDERS wrote: Medication: MOUNJARO  12.5 MG/0.5ML Pen  Has the patient contacted their pharmacy? Yes (Agent: If no, request that the patient contact the pharmacy for the refill. If patient does not wish to contact the pharmacy document the reason why and proceed with request.) (Agent: If yes, when and what did the pharmacy advise?)  This is the patient's preferred pharmacy:  [facility] [address] [facility] - [place], [place] - [address] [facility] [address] [place] [address] Phone: [phone] Fax: [phone]  Is this the correct pharmacy for this prescription? Yes If no, delete pharmacy and type the correct one.   Has the prescription been filled recently? Yes  Is the patient out of the medication? Yes  Has the patient been seen for an appointment in the last year OR does the patient have an upcoming appointment? Yes  Can we respond through MyChart? Yes  Agent: Please be advised that Rx refills may take up to 3 business days. We ask that you follow-up with your pharmacy.

## 2024-09-24 NOTE — Telephone Encounter (Signed)
 Requested medication (s) are due for refill today: yes  Requested medication (s) are on the active medication list: yes  Last refill:  07/06/24  Future visit scheduled: yes  Notes to clinic:   Medication not assigned to a protocol, review manually.      Requested Prescriptions  Pending Prescriptions Disp Refills   MOUNJARO  12.5 MG/0.5ML Pen [Pharmacy Med Name: MOUNJARO  12.5 MG/0.5 ML PEN[$R^]] 6 mL 0    Sig: INJECT THE CONTENTS OF ONE PEN UNDER THE SKIN WEEKLY ON THE SAME DAY EACH WEEK     Off-Protocol Failed - 09/24/2024  3:35 PM      Failed - Medication not assigned to a protocol, review manually.      Passed - Valid encounter within last 12 months    Recent Outpatient Visits           2 months ago Degeneration of intervertebral disc of lumbar region with discogenic back pain   [facility] Gareth Mliss [place], [place]   6 months ago Primary hypertension   [facility] Gareth Mliss [place], [place]

## 2024-09-24 NOTE — Telephone Encounter (Signed)
 Pt overdue for a follow up

## 2024-09-24 NOTE — Telephone Encounter (Signed)
Lvm to sch appt

## 2024-09-25 DIAGNOSIS — M545 Low back pain, unspecified: Secondary | ICD-10-CM | POA: Diagnosis not present

## 2024-09-25 DIAGNOSIS — G8929 Other chronic pain: Secondary | ICD-10-CM | POA: Diagnosis not present

## 2024-09-27 ENCOUNTER — Other Ambulatory Visit: Payer: Self-pay | Admitting: Nurse Practitioner

## 2024-09-27 DIAGNOSIS — E1165 Type 2 diabetes mellitus with hyperglycemia: Secondary | ICD-10-CM

## 2024-09-27 DIAGNOSIS — F900 Attention-deficit hyperactivity disorder, predominantly inattentive type: Secondary | ICD-10-CM | POA: Diagnosis not present

## 2024-09-27 MED ORDER — MOUNJARO 12.5 MG/0.5ML ~~LOC~~ SOAJ: 12.5000 mg | SUBCUTANEOUS | 1 refills | Status: DC

## 2024-09-27 NOTE — Telephone Encounter (Signed)
 Requested medication (s) are due for refill today: Yes  Requested medication (s) are on the active medication list: Yes  Last refill:  07/06/24  Future visit scheduled: Yes  Notes to clinic:  Manual review.    Requested Prescriptions  Pending Prescriptions Disp Refills   tirzepatide  (MOUNJARO ) 12.5 MG/0.5ML Pen 6 mL 0     Off-Protocol Failed - 09/27/2024  3:17 PM      Failed - Medication not assigned to a protocol, review manually.      Passed - Valid encounter within last 12 months    Recent Outpatient Visits           2 months ago Degeneration of intervertebral disc of lumbar region with discogenic back pain   [facility] Gareth Mliss [place], FNP   6 months ago Primary hypertension   [facility] Gareth Mliss [place], [place]

## 2024-10-01 NOTE — Telephone Encounter (Signed)
 Duplicate request, refilled 09/27/24.  Requested Prescriptions  Pending Prescriptions Disp Refills   MOUNJARO  12.5 MG/0.5ML Pen [Pharmacy Med Name: MOUNJARO  12.5 MG/0.5 ML PEN[$R^]] 6 mL 0    Sig: INJECT THE CONTENTS OF ONE PEN UNDER THE SKIN WEEKLY ON THE SAME DAY EACH WEEK     Off-Protocol Failed - 10/01/2024  8:30 AM      Failed - Medication not assigned to a protocol, review manually.      Passed - Valid encounter within last 12 months    Recent Outpatient Visits           2 months ago Degeneration of intervertebral disc of lumbar region with discogenic back pain   [facility] Gareth Mliss [place], F[place]   7 months ago Primary hypertension   [facility] Gareth Mliss [place], [place]

## 2024-10-05 DIAGNOSIS — M545 Low back pain, unspecified: Secondary | ICD-10-CM | POA: Diagnosis not present

## 2024-10-05 DIAGNOSIS — G8929 Other chronic pain: Secondary | ICD-10-CM | POA: Diagnosis not present

## 2024-10-08 ENCOUNTER — Telehealth: Payer: Self-pay | Admitting: Pharmacy Technician

## 2024-10-08 ENCOUNTER — Other Ambulatory Visit (HOSPITAL_COMMUNITY): Payer: Self-pay

## 2024-10-08 NOTE — Telephone Encounter (Signed)
 Pharmacy Patient Advocate Encounter   Received notification from Onbase that prior authorization for Mounjaro  5MG /0.5ML auto-injectors  is due for renewal.   Insurance verification completed.   The patient is insured through [employer].  This PA renewal was from her old plan through [facility] and she is now covered under a different plan. Please disregard.

## 2024-10-09 DIAGNOSIS — M545 Low back pain, unspecified: Secondary | ICD-10-CM | POA: Diagnosis not present

## 2024-10-09 DIAGNOSIS — G8929 Other chronic pain: Secondary | ICD-10-CM | POA: Diagnosis not present

## 2024-10-10 ENCOUNTER — Ambulatory Visit: Admitting: Nurse Practitioner

## 2024-10-11 DIAGNOSIS — F900 Attention-deficit hyperactivity disorder, predominantly inattentive type: Secondary | ICD-10-CM | POA: Diagnosis not present

## 2024-10-23 ENCOUNTER — Ambulatory Visit: Admitting: Nurse Practitioner

## 2024-10-29 ENCOUNTER — Ambulatory Visit: Admitting: Nurse Practitioner

## 2024-10-29 VITALS — BP 130/88 | HR 80 | Temp 98.0°F | Ht 63.0 in | Wt 160.0 lb

## 2024-10-29 DIAGNOSIS — E782 Mixed hyperlipidemia: Secondary | ICD-10-CM

## 2024-10-29 DIAGNOSIS — E1165 Type 2 diabetes mellitus with hyperglycemia: Secondary | ICD-10-CM | POA: Diagnosis not present

## 2024-10-29 DIAGNOSIS — I10 Essential (primary) hypertension: Secondary | ICD-10-CM | POA: Diagnosis not present

## 2024-10-29 DIAGNOSIS — Z6841 Body Mass Index (BMI) 40.0 and over, adult: Secondary | ICD-10-CM

## 2024-10-29 DIAGNOSIS — J4520 Mild intermittent asthma, uncomplicated: Secondary | ICD-10-CM

## 2024-10-29 DIAGNOSIS — F902 Attention-deficit hyperactivity disorder, combined type: Secondary | ICD-10-CM

## 2024-10-29 DIAGNOSIS — Z7985 Long-term (current) use of injectable non-insulin antidiabetic drugs: Secondary | ICD-10-CM

## 2024-10-29 DIAGNOSIS — J301 Allergic rhinitis due to pollen: Secondary | ICD-10-CM

## 2024-10-29 DIAGNOSIS — F330 Major depressive disorder, recurrent, mild: Secondary | ICD-10-CM | POA: Diagnosis not present

## 2024-10-29 DIAGNOSIS — E66813 Obesity, class 3: Secondary | ICD-10-CM

## 2024-10-29 DIAGNOSIS — F411 Generalized anxiety disorder: Secondary | ICD-10-CM

## 2024-10-29 MED ORDER — TIRZEPATIDE 15 MG/0.5ML ~~LOC~~ SOAJ: 15.0000 mg | SUBCUTANEOUS | 0 refills | Status: AC

## 2024-10-29 NOTE — Progress Notes (Signed)
 "  BP 130/88   Pulse 80   Temp 98 F (36.7 C)   Ht 5' 3 (1.6 m)   Wt 160 lb (72.6 kg)   SpO2 98%   BMI 28.34 kg/m    Subjective:    Patient ID: Tracey Cervantes, female    DOB: 09-04-1991, 34 y.o.   MRN: [id number]  HPI: Tracey Cervantes is a 33 y.o. female  Chief Complaint  Patient presents with   Medical Management of Chronic Issues    Pt would like to increase dose on mounjaro    Discussed the use of AI scribe software for clinical note transcription with the patient, who gave verbal consent to proceed.  History of Present Illness Tracey Cervantes is a 34 year old female with type 2 diabetes who presents for a routine follow-up.  Glycemic control and weight loss - Type 2 diabetes managed with Mounjaro  12.5 mg. - Significant weight loss from 204 pounds in May to 160 pounds currently. - Current BMI is 28.34. - Last hemoglobin A1c in May 2025 was 5.5.  Blood pressure management - Hypertension previously managed with hydrochlorothiazide . - Hydrochlorothiazide  discontinued due to low blood pressure readings.  Respiratory symptoms - Asthma is well-controlled without regular medication use. - Uses Flonase as needed for allergic rhinitis.  Neuropsychiatric symptoms - Managed by psychiatry for anxiety, depression, and ADHD. - Current medications: Effexor 150 mg daily, trazodone 25 mg as needed at bedtime, Vyvanse 50 mg daily, prazosin 1 mg at bedtime. - Experiences lightheadedness and dizziness with prazosin, but it improves sleep without causing grogginess. - Undergoing therapy for sleep issues.  Back pain and neuropathic symptoms - Completed six sessions of physical therapy for back pain. - Physical therapy provided education regarding nerve pain and its possible relation to gluteal muscle and sciatic nerve. - MRI planned as it has been over ten years since the last imaging.    Wt Readings from Last 3 Encounters:  10/29/24 160 lb (72.6 kg)  07/26/24 173 lb 12.8 oz (78.8 kg)   03/02/24 204 lb 8 oz (92.8 kg)   Body mass index is 28.34 kg/m.  Flowsheet Row Office Visit from 10/29/2024 in [facility]  1 32 inches        10/29/2024    3:32 PM 07/26/2024   11:26 AM 03/02/2024    9:34 AM  Depression screen PHQ 2/9  Decreased Interest 1 1 0  Down, Depressed, Hopeless 1 1 1   PHQ - 2 Score 2 2 1   Altered sleeping 2 3 1   Tired, decreased energy 3 2 1   Change in appetite 2 1 0  Feeling bad or failure about yourself  1 0 0  Trouble concentrating 0 1 0  Moving slowly or fidgety/restless  0 0  Suicidal thoughts 0 0 0  PHQ-9 Score 10 9  3    Difficult doing work/chores Somewhat difficult Somewhat difficult Not difficult at all     Data saved with a previous flowsheet row definition       10/29/2024    3:32 PM 07/26/2024   11:26 AM 03/02/2024    9:34 AM 11/02/2023    3:05 PM  GAD 7 : Generalized Anxiety Score  Nervous, Anxious, on Edge 2 2 1 2   Control/stop worrying 2 2 0 3  Worry too much - different things 3 3 0 3  Trouble relaxing 3 3 0 2  Restless 2 2 0 2  Easily annoyed or irritable 2 2 0 2  Afraid -  awful might happen 1 1 0 2  Total GAD 7 Score 15 15 1 16   Anxiety Difficulty Somewhat difficult Somewhat difficult Not difficult at all Very difficult     Relevant past medical, surgical, family and social history reviewed and updated as indicated. Interim medical history since our last visit reviewed. Allergies and medications reviewed and updated.  Review of Systems  Constitutional: Negative for fever or weight change.  Respiratory: Negative for cough and shortness of breath.   Cardiovascular: Negative for chest pain or palpitations.  Gastrointestinal: Negative for abdominal pain, no bowel changes.  Musculoskeletal: Negative for gait problem or joint swelling.  Skin: Negative for rash.  Neurological: Negative for dizziness or headache.  No other specific complaints in a complete review of systems (except as listed in HPI above).       Objective:      BP 130/88   Pulse 80   Temp 98 F (36.7 C)   Ht 5' 3 (1.6 m)   Wt 160 lb (72.6 kg)   SpO2 98%   BMI 28.34 kg/m    Wt Readings from Last 3 Encounters:  10/29/24 160 lb (72.6 kg)  07/26/24 173 lb 12.8 oz (78.8 kg)  03/02/24 204 lb 8 oz (92.8 kg)    Physical Exam MEASUREMENTS: Weight- 160, BMI- 28.34. GENERAL: Alert, cooperative, well developed, no acute distress HEENT: Normocephalic, normal oropharynx, moist mucous membranes CHEST: Clear to auscultation bilaterally, no wheezes, rhonchi, or crackles CARDIOVASCULAR: Normal heart rate and rhythm, S1 and S2 normal without murmurs ABDOMEN: Soft, non-tender, non-distended, without organomegaly, normal bowel sounds EXTREMITIES: No cyanosis or edema NEUROLOGICAL: Cranial nerves grossly intact, moves all extremities without gross motor or sensory deficit  Results for orders placed or performed during the hospital encounter of 07/30/24  [facility] Molecular Screen - Blood ([facility])   Collection Time: 07/30/24  9:10 AM  Result Value Ref Range   Genetic Analysis Overall Interpretation Negative    Genetic Disease Assessed      This is a screening test and does not detect all pathogenic or likely pathogenic variant(s) in the tested genes; diagnostic testing is recommended for individuals with a personal or family history of heart disease or hereditary cancer. [employer] [facility]  Population Screen is a screening test that analyzes 11 genes related to hereditary breast and ovarian cancer (HBOC) syndrome, Lynch syndrome, and familial hypercholesterolemia. This test only reports clinically significant pathogenic and likely  pathogenic variants but does not report variants of uncertain significance (VUS). In addition, analysis of the PMS2 gene excludes exons 11-15, which overlap with a known pseudogene (PMS2CL).    Genetic Analysis Report      No pathogenic or likely pathogenic variants were detected in the  genes analyzed by this test.Genetic test results should be interpreted in the context of an individual's personal medical and family history. Alteration to medical management is NOT  recommended based solely on this result. Clinical correlation is advised.Additional Considerations- This is a screening test; individuals may still carry pathogenic or likely pathogenic variant(s) in the tested genes that are not detected by this test.-  For individuals at risk for these or other related conditions based on factors including personal or family history, diagnostic testing is recommended.- The absence of pathogenic or likely pathogenic variant(s) in the analyzed genes, while reassuring,  does not eliminate the possibility of a hereditary condition; there are other variants and genes associated with heart disease and hereditary cancer that are not included in this  test.    Genes Tested See Notes    Disclaimer See Notes    Sequencing Location See Notes    Interpretation Methods and Limitations See Notes           Assessment & Plan:   Problem List Items Addressed This Visit       Cardiovascular and Mediastinum   Primary hypertension - Primary   Relevant Orders   CBC with Differential/Platelet   Comprehensive metabolic panel with GFR     Respiratory   Allergic rhinitis due to pollen   Asthma     Endocrine   Type 2 diabetes mellitus with hyperglycemia, without long-term current use of insulin  (HCC)   Relevant Medications   tirzepatide  (MOUNJARO ) 15 MG/0.5ML Pen   Other Relevant Orders   Comprehensive metabolic panel with GFR   Hemoglobin A1c   Microalbumin / creatinine urine ratio     Other   Mild episode of recurrent major depressive disorder   GAD (generalized anxiety disorder)   Attention deficit hyperactivity disorder (ADHD), combined type   Hyperlipidemia   Relevant Orders   Lipid panel   Class 3 severe obesity due to excess calories with serious comorbidity and body mass  index (BMI) of 40.0 to 44.9 in adult [facility])   Relevant Medications   tirzepatide  (MOUNJARO ) 15 MG/0.5ML Pen     Assessment and Plan Assessment & Plan Type 2 diabetes mellitus Well-controlled with an A1c of 5.5% as of May 2024. Microalbumin urine test was normal. She is on Mounjaro  12.5 mg, which has been effective but less potent over time. - Increased Mounjaro  to 15 mg after current supply is finished in two months. - Ordered microalbumin urine test.  Primary hypertension Previously managed with hydrochlorothiazide , which was discontinued due to hypotension. Current blood pressure readings are good.  Obesity Significant weight loss from 204 pounds in May to 160 pounds currently. BMI has decreased to 28.34. Mounjaro  has contributed to weight loss. - Increased Mounjaro  to 15 mg after current supply is finished in two months. -doing well Wt Readings from Last 3 Encounters:  10/29/24 160 lb (72.6 kg)  07/26/24 173 lb 12.8 oz (78.8 kg)  03/02/24 204 lb 8 oz (92.8 kg)     Mixed hyperlipidemia Lipid Panel     Component Value Date/Time   CHOL 188 10/05/2023 1531   TRIG 86 10/05/2023 1531   HDL 52 10/05/2023 1531   CHOLHDL 3.6 10/05/2023 1531   LDLCALC 117 (H) 10/05/2023 1531   Working on lifestyle modification  Mild intermittent asthma Well-controlled without medication.  Allergic rhinitis Managed with Flonase as needed.  General health maintenance Up to date on diabetic foot exam. Labs are due for a full panel. - Ordered full panel labs.        Follow up plan: Return in about 6 months (around 04/28/2025) for follow up. "

## 2024-10-30 ENCOUNTER — Other Ambulatory Visit (HOSPITAL_COMMUNITY): Payer: Self-pay

## 2024-10-30 ENCOUNTER — Ambulatory Visit: Payer: Self-pay | Admitting: Nurse Practitioner

## 2024-10-30 ENCOUNTER — Telehealth: Payer: Self-pay | Admitting: Pharmacy Technician

## 2024-10-30 LAB — COMPREHENSIVE METABOLIC PANEL WITH GFR
AG Ratio: 1.5 (calc) (ref 1.0–2.5)
ALT: 22 U/L (ref 6–29)
AST: 16 U/L (ref 10–30)
Albumin: 4.1 g/dL (ref 3.6–5.1)
Alkaline phosphatase (APISO): 69 U/L (ref 31–125)
BUN: 10 mg/dL (ref 7–25)
CO2: 27 mmol/L (ref 20–32)
Calcium: 9.4 mg/dL (ref 8.6–10.2)
Chloride: 104 mmol/L (ref 98–110)
Creat: 0.88 mg/dL (ref 0.50–0.97)
Globulin: 2.7 g/dL (ref 1.9–3.7)
Glucose, Bld: 92 mg/dL (ref 65–99)
Potassium: 4.6 mmol/L (ref 3.5–5.3)
Sodium: 140 mmol/L (ref 135–146)
Total Bilirubin: 0.4 mg/dL (ref 0.2–1.2)
Total Protein: 6.8 g/dL (ref 6.1–8.1)
eGFR: 89 mL/min/1.73m2

## 2024-10-30 LAB — CBC WITH DIFFERENTIAL/PLATELET
Absolute Lymphocytes: 2655 {cells}/uL (ref 850–3900)
Absolute Monocytes: 443 {cells}/uL (ref 200–950)
Basophils Absolute: 41 {cells}/uL (ref 0–200)
Basophils Relative: 0.7 %
Eosinophils Absolute: 118 {cells}/uL (ref 15–500)
Eosinophils Relative: 2 %
HCT: 43.4 % (ref 35.9–46.0)
Hemoglobin: 14.2 g/dL (ref 11.7–15.5)
MCH: 29.8 pg (ref 27.0–33.0)
MCHC: 32.7 g/dL (ref 31.6–35.4)
MCV: 91 fL (ref 81.4–101.7)
MPV: 9.5 fL (ref 7.5–12.5)
Monocytes Relative: 7.5 %
Neutro Abs: 2643 {cells}/uL (ref 1500–7800)
Neutrophils Relative %: 44.8 %
Platelets: 307 Thousand/uL (ref 140–400)
RBC: 4.77 Million/uL (ref 3.80–5.10)
RDW: 12.7 % (ref 11.0–15.0)
Total Lymphocyte: 45 %
WBC: 5.9 Thousand/uL (ref 3.8–10.8)

## 2024-10-30 LAB — LIPID PANEL
Cholesterol: 209 mg/dL — ABNORMAL HIGH
HDL: 49 mg/dL — ABNORMAL LOW
LDL Cholesterol (Calc): 143 mg/dL — ABNORMAL HIGH
Non-HDL Cholesterol (Calc): 160 mg/dL — ABNORMAL HIGH
Total CHOL/HDL Ratio: 4.3 (calc)
Triglycerides: 73 mg/dL

## 2024-10-30 LAB — HEMOGLOBIN A1C
Hgb A1c MFr Bld: 5.4 %
Mean Plasma Glucose: 108 mg/dL
eAG (mmol/L): 6 mmol/L

## 2024-10-30 LAB — MICROALBUMIN / CREATININE URINE RATIO
Creatinine, Urine: 151 mg/dL (ref 20–275)
Microalb Creat Ratio: 5 mg/g{creat}
Microalb, Ur: 0.7 mg/dL

## 2024-10-30 NOTE — Telephone Encounter (Signed)
 Pharmacy Patient Advocate Encounter  Received notification from [employer] that Prior Authorization for Mounjaro  15MG /0.5ML auto-injectors has been APPROVED from 10/30/24 to 10/30/25. Unable to obtain price due to refill too soon rejection, last fill date 09/29/24 for 3 months next available fill date once 80% of the medication is used.   PA #/Case ID/Reference #: [id number]

## 2024-10-30 NOTE — Telephone Encounter (Signed)
 Pharmacy Patient Advocate Encounter   Received notification from CoverMyMeds that prior authorization for Mounjaro  15MG /0.5ML auto-injectors  is required/requested.   Insurance verification completed.   The patient is insured through [facility].   Per test claim: PA required; PA started via CoverMyMeds. KEY [id number] . Waiting for clinical questions to populate.

## 2025-04-29 ENCOUNTER — Ambulatory Visit: Admitting: Nurse Practitioner
